# Patient Record
Sex: Female | Born: 1970 | Race: Black or African American | Hispanic: No | Marital: Married | Smoking: Never smoker
Health system: Southern US, Community
[De-identification: ages and names within clinical notes are randomized; demographics above are authoritative.]

## PROBLEM LIST (undated history)

## (undated) HISTORY — PX: CHOLECYSTECTOMY: SHX55

---

## 1999-06-09 ENCOUNTER — Encounter: Payer: Self-pay | Admitting: Emergency Medicine

## 1999-06-09 ENCOUNTER — Emergency Department (HOSPITAL_COMMUNITY): Admission: EM | Admit: 1999-06-09 | Discharge: 1999-06-09 | Payer: Self-pay | Admitting: Emergency Medicine

## 2003-02-17 ENCOUNTER — Other Ambulatory Visit: Admission: RE | Admit: 2003-02-17 | Discharge: 2003-02-17 | Payer: Self-pay | Admitting: Obstetrics and Gynecology

## 2003-07-15 ENCOUNTER — Encounter: Admission: RE | Admit: 2003-07-15 | Discharge: 2003-07-15 | Payer: Self-pay | Admitting: Obstetrics and Gynecology

## 2003-09-08 ENCOUNTER — Inpatient Hospital Stay (HOSPITAL_COMMUNITY): Admission: AD | Admit: 2003-09-08 | Discharge: 2003-09-11 | Payer: Self-pay | Admitting: Obstetrics and Gynecology

## 2003-09-12 ENCOUNTER — Encounter: Admission: RE | Admit: 2003-09-12 | Discharge: 2003-10-12 | Payer: Self-pay | Admitting: Obstetrics and Gynecology

## 2003-10-13 ENCOUNTER — Encounter: Admission: RE | Admit: 2003-10-13 | Discharge: 2003-11-12 | Payer: Self-pay | Admitting: Obstetrics and Gynecology

## 2003-10-21 ENCOUNTER — Other Ambulatory Visit: Admission: RE | Admit: 2003-10-21 | Discharge: 2003-10-21 | Payer: Self-pay | Admitting: Obstetrics and Gynecology

## 2003-12-11 ENCOUNTER — Encounter: Admission: RE | Admit: 2003-12-11 | Discharge: 2004-01-10 | Payer: Self-pay | Admitting: Obstetrics and Gynecology

## 2004-02-10 ENCOUNTER — Encounter: Admission: RE | Admit: 2004-02-10 | Discharge: 2004-03-11 | Payer: Self-pay | Admitting: Obstetrics and Gynecology

## 2004-04-11 ENCOUNTER — Encounter: Admission: RE | Admit: 2004-04-11 | Discharge: 2004-05-11 | Payer: Self-pay | Admitting: Obstetrics and Gynecology

## 2004-05-12 ENCOUNTER — Encounter: Admission: RE | Admit: 2004-05-12 | Discharge: 2004-06-11 | Payer: Self-pay | Admitting: Obstetrics and Gynecology

## 2004-07-12 ENCOUNTER — Encounter: Admission: RE | Admit: 2004-07-12 | Discharge: 2004-08-11 | Payer: Self-pay | Admitting: Obstetrics and Gynecology

## 2019-09-04 ENCOUNTER — Ambulatory Visit: Payer: BC Managed Care – PPO | Attending: Internal Medicine

## 2019-09-04 DIAGNOSIS — Z20822 Contact with and (suspected) exposure to covid-19: Secondary | ICD-10-CM

## 2019-09-06 ENCOUNTER — Emergency Department (HOSPITAL_COMMUNITY): Payer: BC Managed Care – PPO

## 2019-09-06 ENCOUNTER — Other Ambulatory Visit: Payer: Self-pay

## 2019-09-06 ENCOUNTER — Encounter (HOSPITAL_COMMUNITY): Payer: Self-pay

## 2019-09-06 ENCOUNTER — Ambulatory Visit (HOSPITAL_COMMUNITY)
Admission: EM | Admit: 2019-09-06 | Discharge: 2019-09-06 | Disposition: A | Discharge status: Home / Self Care | Payer: BC Managed Care – PPO | Source: Home / Self Care | Attending: Family Medicine | Admitting: Family Medicine

## 2019-09-06 ENCOUNTER — Inpatient Hospital Stay (HOSPITAL_COMMUNITY)
Admission: EM | Admit: 2019-09-06 | Discharge: 2019-09-20 | DRG: 974 | Disposition: A | Discharge status: Home / Self Care | Payer: BC Managed Care – PPO | Source: Ambulatory Visit | Attending: Internal Medicine | Admitting: Internal Medicine

## 2019-09-06 ENCOUNTER — Ambulatory Visit (INDEPENDENT_AMBULATORY_CARE_PROVIDER_SITE_OTHER): Payer: BC Managed Care – PPO

## 2019-09-06 DIAGNOSIS — R1110 Vomiting, unspecified: Secondary | ICD-10-CM | POA: Diagnosis not present

## 2019-09-06 DIAGNOSIS — Z9981 Dependence on supplemental oxygen: Secondary | ICD-10-CM

## 2019-09-06 DIAGNOSIS — J189 Pneumonia, unspecified organism: Secondary | ICD-10-CM

## 2019-09-06 DIAGNOSIS — B59 Pneumocystosis: Secondary | ICD-10-CM | POA: Diagnosis present

## 2019-09-06 DIAGNOSIS — Z9049 Acquired absence of other specified parts of digestive tract: Secondary | ICD-10-CM | POA: Diagnosis not present

## 2019-09-06 DIAGNOSIS — K2970 Gastritis, unspecified, without bleeding: Secondary | ICD-10-CM | POA: Diagnosis not present

## 2019-09-06 DIAGNOSIS — R7401 Elevation of levels of liver transaminase levels: Secondary | ICD-10-CM | POA: Diagnosis not present

## 2019-09-06 DIAGNOSIS — Z98891 History of uterine scar from previous surgery: Secondary | ICD-10-CM | POA: Diagnosis not present

## 2019-09-06 DIAGNOSIS — E871 Hypo-osmolality and hyponatremia: Secondary | ICD-10-CM | POA: Diagnosis not present

## 2019-09-06 DIAGNOSIS — R0602 Shortness of breath: Secondary | ICD-10-CM | POA: Diagnosis not present

## 2019-09-06 DIAGNOSIS — R0902 Hypoxemia: Secondary | ICD-10-CM | POA: Diagnosis present

## 2019-09-06 DIAGNOSIS — J9601 Acute respiratory failure with hypoxia: Secondary | ICD-10-CM | POA: Diagnosis present

## 2019-09-06 DIAGNOSIS — R112 Nausea with vomiting, unspecified: Secondary | ICD-10-CM | POA: Diagnosis not present

## 2019-09-06 DIAGNOSIS — R1013 Epigastric pain: Secondary | ICD-10-CM | POA: Diagnosis not present

## 2019-09-06 DIAGNOSIS — D473 Essential (hemorrhagic) thrombocythemia: Secondary | ICD-10-CM | POA: Diagnosis present

## 2019-09-06 DIAGNOSIS — R441 Visual hallucinations: Secondary | ICD-10-CM | POA: Diagnosis not present

## 2019-09-06 DIAGNOSIS — Z79899 Other long term (current) drug therapy: Secondary | ICD-10-CM | POA: Diagnosis not present

## 2019-09-06 DIAGNOSIS — Z21 Asymptomatic human immunodeficiency virus [HIV] infection status: Secondary | ICD-10-CM | POA: Diagnosis not present

## 2019-09-06 DIAGNOSIS — I313 Pericardial effusion (noninflammatory): Secondary | ICD-10-CM | POA: Diagnosis present

## 2019-09-06 DIAGNOSIS — R110 Nausea: Secondary | ICD-10-CM | POA: Diagnosis not present

## 2019-09-06 DIAGNOSIS — F419 Anxiety disorder, unspecified: Secondary | ICD-10-CM | POA: Diagnosis present

## 2019-09-06 DIAGNOSIS — I4519 Other right bundle-branch block: Secondary | ICD-10-CM | POA: Diagnosis present

## 2019-09-06 DIAGNOSIS — E876 Hypokalemia: Secondary | ICD-10-CM | POA: Diagnosis not present

## 2019-09-06 DIAGNOSIS — B20 Human immunodeficiency virus [HIV] disease: Principal | ICD-10-CM | POA: Diagnosis present

## 2019-09-06 DIAGNOSIS — R400 Somnolence: Secondary | ICD-10-CM | POA: Diagnosis not present

## 2019-09-06 DIAGNOSIS — Z5329 Procedure and treatment not carried out because of patient's decision for other reasons: Secondary | ICD-10-CM | POA: Diagnosis not present

## 2019-09-06 DIAGNOSIS — R590 Localized enlarged lymph nodes: Secondary | ICD-10-CM | POA: Diagnosis not present

## 2019-09-06 DIAGNOSIS — D649 Anemia, unspecified: Secondary | ICD-10-CM | POA: Diagnosis present

## 2019-09-06 DIAGNOSIS — Z20822 Contact with and (suspected) exposure to covid-19: Secondary | ICD-10-CM | POA: Diagnosis present

## 2019-09-06 LAB — BASIC METABOLIC PANEL
Anion gap: 12 (ref 5–15)
BUN: 8 mg/dL (ref 6–20)
CO2: 22 mmol/L (ref 22–32)
Calcium: 9 mg/dL (ref 8.9–10.3)
Chloride: 101 mmol/L (ref 98–111)
Creatinine, Ser: 0.61 mg/dL (ref 0.44–1.00)
GFR calc Af Amer: >60 mL/min (ref >60)
GFR calc non Af Amer: >60 mL/min (ref >60)
Glucose, Bld: 95 mg/dL (ref 70–99)
Potassium: 3.8 mmol/L (ref 3.5–5.1)
Sodium: 135 mmol/L (ref 135–145)

## 2019-09-06 LAB — CBC
HCT: 37.3 % (ref 36.0–46.0)
Hemoglobin: 11.7 g/dL — ABNORMAL LOW (ref 12.0–15.0)
MCH: 26.9 pg (ref 26.0–34.0)
MCHC: 31.4 g/dL (ref 30.0–36.0)
MCV: 85.7 fL (ref 80.0–100.0)
Platelets: 429 10*3/uL — ABNORMAL HIGH (ref 150–400)
RBC: 4.35 MIL/uL (ref 3.87–5.11)
RDW: 14.8 % (ref 11.5–15.5)
WBC: 8.8 10*3/uL (ref 4.0–10.5)
nRBC: 0 % (ref 0.0–0.2)

## 2019-09-06 LAB — POCT I-STAT EG7
Bicarbonate: 25.4 mmol/L (ref 20.0–28.0)
Calcium, Ion: 1.15 mmol/L (ref 1.15–1.40)
HCT: 33 % — ABNORMAL LOW (ref 36.0–46.0)
Hemoglobin: 11.2 g/dL — ABNORMAL LOW (ref 12.0–15.0)
O2 Saturation: 59 %
Potassium: 4 mmol/L (ref 3.5–5.1)
Sodium: 137 mmol/L (ref 135–145)
TCO2: 27 mmol/L (ref 22–32)
pCO2, Ven: 42 mmHg — ABNORMAL LOW (ref 44.0–60.0)
pH, Ven: 7.39 (ref 7.250–7.430)
pO2, Ven: 31 mmHg — CL (ref 32.0–45.0)

## 2019-09-06 LAB — TROPONIN I (HIGH SENSITIVITY): Troponin I (High Sensitivity): <2 ng/L (ref <18)

## 2019-09-06 LAB — I-STAT BETA HCG BLOOD, ED (MC, WL, AP ONLY): I-stat hCG, quantitative: <5 m[IU]/mL (ref <5)

## 2019-09-06 LAB — POC SARS CORONAVIRUS 2 AG -  ED: SARS Coronavirus 2 Ag: NEGATIVE

## 2019-09-06 LAB — POC SARS CORONAVIRUS 2 AG: SARS Coronavirus 2 Ag: NEGATIVE

## 2019-09-06 LAB — BRAIN NATRIURETIC PEPTIDE: B Natriuretic Peptide: 28.4 pg/mL (ref 0.0–100.0)

## 2019-09-06 LAB — NOVEL CORONAVIRUS, NAA: SARS-CoV-2, NAA: NOT DETECTED

## 2019-09-06 MED ORDER — SODIUM CHLORIDE 0.9 % IV SOLN
1.0000 g | Freq: Once | INTRAVENOUS | Status: AC
  Administered 2019-09-06: 1 g via INTRAVENOUS
  Filled 2019-09-06: qty 10

## 2019-09-06 MED ORDER — SODIUM CHLORIDE 0.9 % IV SOLN
500.0000 mg | Freq: Once | INTRAVENOUS | Status: AC
  Administered 2019-09-07: 500 mg via INTRAVENOUS
  Filled 2019-09-06: qty 500

## 2019-09-06 MED ORDER — LACTATED RINGERS IV BOLUS
1000.0000 mL | Freq: Once | INTRAVENOUS | Status: AC
  Administered 2019-09-06: 1000 mL via INTRAVENOUS

## 2019-09-06 MED ORDER — IOHEXOL 350 MG/ML SOLN
100.0000 mL | Freq: Once | INTRAVENOUS | Status: AC | PRN
  Administered 2019-09-06: 100 mL via INTRAVENOUS

## 2019-09-06 NOTE — H&P (Addendum)
History and Physical    TORRE SHUTES [id number] DOB: 16-Nov-1970 DOA: 09/06/2019  PCP: Patient, No Pcp Per  Patient coming from: home   Chief Complaint: cough  HPI: FALLEN DOHNER is a 49 y.o. female with medical history significant for nothing presents w/ above.  Usual state of health until 12/24. Then developed shortness of breath with ambulation. Also with cough. Temp to 101 or 102. Also fatigue, decreased appetite. Cough is dry. No vomiting. One episode of diarrhea. Had a covid test at work (negative), 2 days ago (negative), and today at urgent care (negative), all since symptoms started. Hasn't been treated with anything. No hx pneumonia. No personal or fam hx of venous thrombus. No recent illness/hospitalization. No known covid exposure, whole family recently tested, negative.   ED Course: imaging, labs, abx  Review of Systems: As per HPI otherwise 10 point review of systems negative.    History reviewed. No pertinent past medical history.  Past Surgical History:  Procedure Laterality Date  . CESAREAN SECTION    . CHOLECYSTECTOMY       reports that she has never smoked. She has never used smokeless tobacco. She reports that she does not drink alcohol or use drugs.  No Known Allergies  No family history on file.  Prior to Admission medications   Not on File    Physical Exam: Vitals:   09/06/19 1845 09/06/19 2000 09/06/19 2015 09/06/19 2030  BP: (!) 131/94 121/83 122/86 120/82  Pulse: (!) 105 78 87 79  Resp: (!) 27 (!) 47 (!) 43 (!) 48  Temp:      TempSrc:      SpO2: 96% 100% 100% 100%  Weight:      Height:        Constitutional: No acute distress Head: Atraumatic Eyes: Conjunctiva clear ENM: Moist mucous membranes. Normal dentition.  Neck: Supple Respiratory: tachypnic. Scattered rhonchi Cardiovascular: Regular rate and rhythm. No murmurs/rubs/gallops. Abdomen: Non-tender, non-distended. No masses. No rebound or guarding. Positive bowel  sounds. Musculoskeletal: No joint deformity upper and lower extremities. Normal ROM, no contractures. Normal muscle tone.  Skin: No rashes, lesions, or ulcers.  Extremities: No peripheral edema. Palpable peripheral pulses. Neurologic: Alert, moving all 4 extremities. Psychiatric: Normal insight and judgement.   Labs on Admission: I have personally reviewed following labs and imaging studies  CBC: Recent Labs  Lab 09/06/19 1605 09/06/19 2049  WBC 8.8  --   HGB 11.7* 11.2*  HCT 37.3 33.0*  MCV 85.7  --   PLT 429*  --    Basic Metabolic Panel: Recent Labs  Lab 09/06/19 1605 09/06/19 2049  NA 135 137  K 3.8 4.0  CL 101  --   CO2 22  --   GLUCOSE 95  --   BUN 8  --   CREATININE 0.61  --   CALCIUM 9.0  --    GFR: Estimated Creatinine Clearance: 68 mL/min (by C-G formula based on SCr of 0.61 mg/dL). Liver Function Tests: No results for input(s): AST, ALT, ALKPHOS, BILITOT, PROT, ALBUMIN in the last 168 hours. No results for input(s): LIPASE, AMYLASE in the last 168 hours. No results for input(s): AMMONIA in the last 168 hours. Coagulation Profile: No results for input(s): INR, PROTIME in the last 168 hours. Cardiac Enzymes: No results for input(s): CKTOTAL, CKMB, CKMBINDEX, TROPONINI in the last 168 hours. BNP (last 3 results) No results for input(s): PROBNP in the last 8760 hours. HbA1C: No results for input(s): HGBA1C in the last  72 hours. CBG: No results for input(s): GLUCAP in the last 168 hours. Lipid Profile: No results for input(s): CHOL, HDL, LDLCALC, TRIG, CHOLHDL, LDLDIRECT in the last 72 hours. Thyroid Function Tests: No results for input(s): TSH, T4TOTAL, FREET4, T3FREE, THYROIDAB in the last 72 hours. Anemia Panel: No results for input(s): VITAMINB12, FOLATE, FERRITIN, TIBC, IRON, RETICCTPCT in the last 72 hours. Urine analysis: No results found for: COLORURINE, APPEARANCEUR, LABSPEC, [place], GLUCOSEU, HGBUR, BILIRUBINUR, KETONESUR, PROTEINUR,  UROBILINOGEN, NITRITE, LEUKOCYTESUR  Radiological Exams on Admission: DG Chest 2 View  Result Date: 09/06/2019 CLINICAL DATA:  Shortness of breath EXAM: CHEST - 2 VIEW COMPARISON:  None. FINDINGS: The heart size and mediastinal contours are within normal limits. Bilateral heterogeneous airspace opacity, most conspicuous in the bilateral lung bases. The visualized skeletal structures are unremarkable. IMPRESSION: Bilateral heterogeneous airspace opacity, most conspicuous in the bilateral lung bases, consistent with multifocal infection and COVID-19 infection, if suspected. Electronically Signed   By: Eddie Candle M.D.   On: 09/06/2019 14:32   CT Angio Chest PE W and/or Wo Contrast  Result Date: 09/06/2019 CLINICAL DATA:  Shortness of breath recent COVID diagnosis EXAM: CT ANGIOGRAPHY CHEST WITH CONTRAST TECHNIQUE: Multidetector CT imaging of the chest was performed using the standard protocol during bolus administration of intravenous contrast. Multiplanar CT image reconstructions and MIPs were obtained to evaluate the vascular anatomy. CONTRAST:  76 mL OMNIPAQUE IOHEXOL 350 MG/ML SOLN COMPARISON:  Chest x-ray 09/06/2019 FINDINGS: Cardiovascular: Satisfactory opacification of the pulmonary arteries to the segmental level by density measurements though overall noisy appearance of the images. Limited evaluation for subsegmental PE in the lower lobes secondary to respiratory motion artifact. Nonaneurysmal aorta. No dissection seen. Normal heart size. No pericardial effusion. Mediastinum/Nodes: No enlarged mediastinal, hilar, or axillary lymph nodes. Thyroid gland, trachea, and esophagus demonstrate no significant findings. Lungs/Pleura: Extensive heterogeneous bilateral ground-glass density. No pleural effusion or pneumothorax. Upper Abdomen: No acute abnormality. Musculoskeletal: No chest wall abnormality. No acute or significant osseous findings. Review of the MIP images confirms the above findings. IMPRESSION:  1. Limited evaluation for distal PE in the lower lobes secondary to respiratory motion artifact. No acute central embolus is seen. 2. Extensive heterogeneous bilateral ground-glass densities, consistent with bilateral pneumonia and given history of COVID positivity. Electronically Signed   By: Donavan Foil M.D.   On: 09/06/2019 21:13    EKG: Independently reviewed. rbb incomplete  Assessment/Plan Principal Problem:   CAP (community acquired pneumonia) Active Problems:   Hypoxia   # Community acquired pneumonia - patchy ground glass opacities on cxr; 2 wks cough and fevers and shortness of breath. Highly suspicious for covid (no known exposures), but negative antigen test today, negative pcr 2 days ago, and negative test at work a week or so ago. CTA w/o signs PE but suboptimal study. No signs DVT on exam. Mildly hypoxic to low 80s and tachypnic, but resting comfortably. - cont ceftriaxone and azithromycin - f/u procalcitonin - f/u inflammatory markers, consider repeat cta if d dimer markedly elevated - f/u urine legionella and pneumococcal antigens - cont o2 - incentive spirometry - f/u repeat covid pcr; airborne/contact precautions for now - f/u flu  - d5 1/2 ns @ 100  # anemia - mild, normocytic. With mild thrombocytosis - repeat cbc in am, with ferritin  DVT prophylaxis: lovenox Code Status: full  Family Communication: daughter  Disposition Plan: tbd  Consults called: none  Admission status: med/surg    Desma Maxim MD [facility] Pager [phone]  If  7PM-7AM, please contact night-coverage [email or web address] Password [facility]  09/06/2019, 10:45 PM

## 2019-09-06 NOTE — ED Triage Notes (Signed)
Pt sent here from UC for further evaluation of sob since christmas day, pt had two negative covid tests since then. resp labored, tachypnea noted. Pt a.o, pt 98% in triage, 2L [place] applied for comfort

## 2019-09-06 NOTE — ED Notes (Addendum)
This tech assisted the pt to the bedside commode to urinate.   The pt seemed to maintain her sats at 98-100% on 4L [place] while on bedside commode.Marland Kitchen  Upon moving back to the bed, the pt became tachypneic to the point of hyperventalation at 36 breaths a minute and and sats registered at 78%.  This tech titrated her 02 from 4L to 6 L [place] and coached the pt on slowing her breathing.  After a couple of minutes, the pt's sat's returned to 100% and her resp. Rate returned to 17 bpm.  The pt is now maintaining 100% at 4L [place].

## 2019-09-06 NOTE — ED Triage Notes (Signed)
Pt reports she feels SOB since Christmas. Pt reports negative COVID test 2 days ago. Pt denies chest pain.

## 2019-09-06 NOTE — ED Notes (Signed)
  Took patient to the restroom. Pt stated she couldn't catch her breath. O2 93% on 2L nasal cannula.

## 2019-09-06 NOTE — ED Notes (Signed)
Patient ambulatory in hallway, HR 127 and O2 88% on room air. Will notify provider, pt hyperventilating while ambulating.

## 2019-09-06 NOTE — Discharge Instructions (Addendum)
You will need further evaluation and treatment, as your oxygen levels are too low.

## 2019-09-06 NOTE — ED Notes (Signed)
Vital signs reported to provider N. Burky.

## 2019-09-06 NOTE — ED Provider Notes (Signed)
[facility]    CSN: [id number] Arrival date & time: 09/06/19  1340      History   Chief Complaint Chief Complaint  Patient presents with  . Shortness of Breath    HPI Deborah Durham is a 49 y.o. female.   Deborah Durham presents with complaints of shortness of breath . Started 12/25. covid test done at work which was negative. Had to leave work the following week due to shortness of breath. Went to [facility] two days ago and had another covid test which was negative. Still feels shortness of breath . Chest pain  If she lays on her right side. Fevers, 12/25 up to 102. No known ill contacts. Works for [employer] so is around a lot of people, wears mask and gloves. Her mother and son tested negative for covid-19, they have not been ill. Sore throat intermittently. Doesn't smoke. Not on birth control. No recent travel. Pain with deep inspiration. Diarrhea in the past week. Denies any previous similar. Has had the flu in the past but states this feels different. Headache's. No body aches. Tylenol, has helped with headache. mucinex has helped as well. No leg swelling, no calf pain.      History reviewed. No pertinent past medical history.  There are no problems to display for this patient.   Past Surgical History:  Procedure Laterality Date  . CESAREAN SECTION    . CHOLECYSTECTOMY      OB History   No obstetric history on file.      Home Medications    Prior to Admission medications   Not on File    Family History History reviewed. No pertinent family history.  Social History Social History   Tobacco Use  . Smoking status: Never Smoker  . Smokeless tobacco: Never Used  Substance Use Topics  . Alcohol use: Never  . Drug use: Never     Allergies   Patient has no known allergies.   Review of Systems Review of Systems   Physical Exam Triage Vital Signs ED Triage Vitals  Enc Vitals Group     BP 09/06/19 1408 115/81     Pulse  Rate 09/06/19 1408 (!) 114     Resp 09/06/19 1408 (!) 22     Temp 09/06/19 1408 99.3 F (37.4 C)     Temp Source 09/06/19 1408 Oral     SpO2 09/06/19 1408 93 %     Weight --      Height --      Head Circumference --      Peak Flow --      Pain Score 09/06/19 1410 0     Pain Loc --      Pain Edu? --      Excl. in [place]? --    No data found.  Updated Vital Signs BP 115/81 (BP Location: Right Arm)   Pulse (!) 114   Temp 99.3 F (37.4 C) (Oral)   Resp (!) 22   LMP  (Within Weeks) Comment: 4 weeks  SpO2 93%    Physical Exam Constitutional:      Appearance: She is ill-appearing.  HENT:     Head: Normocephalic and atraumatic.  Cardiovascular:     Rate and Rhythm: Tachycardia present.  Pulmonary:     Effort: Tachypnea present.     Breath sounds: Decreased breath sounds present. No wheezing.     Comments: Patient speaking in short sentences with quick shallow breathing with noted shortness  of breath  At rest; no cough throughout exam and no obvious abnormal lung sounds  Neurological:     Mental Status: She is alert.    EKG:  Sinus tach. No stwave changes as interpreted by me.    UC Treatments / Results  Labs (all labs ordered are listed, but only abnormal results are displayed) Labs Reviewed  POC SARS CORONAVIRUS 2 AG -  ED    EKG   Radiology DG Chest 2 View  Result Date: 09/06/2019 CLINICAL DATA:  Shortness of breath EXAM: CHEST - 2 VIEW COMPARISON:  None. FINDINGS: The heart size and mediastinal contours are within normal limits. Bilateral heterogeneous airspace opacity, most conspicuous in the bilateral lung bases. The visualized skeletal structures are unremarkable. IMPRESSION: Bilateral heterogeneous airspace opacity, most conspicuous in the bilateral lung bases, consistent with multifocal infection and COVID-19 infection, if suspected. Electronically Signed   By: Eddie Candle M.D.   On: 09/06/2019 14:32    Procedures Procedures (including critical care  time)  Medications Ordered in UC Medications - No data to display  Initial Impression / Assessment and Plan / UC Course  I have reviewed the triage vital signs and the nursing notes.  Pertinent labs & imaging results that were available during my care of the patient were reviewed by me and considered in my medical decision making (see chart for details).     Patient with significant dyspnea and work of breathing. O2 93% at rest, drops to 88% with ambulation. Chest xray with bilateral multifocal infection. She is otherwise healthy. Negative rapid covid testing, outside of range however, with xray this is still highly suspicious of covid-19  1507: CMA notified this provider that patient desat to 88% on RA with ambulation. Once back in room at rest continued to drop to 80%. O2 by nasal cannula applied to get sats >90%. sats at 100% with 2LNC and HR improved to 88. Patient transported to ED for further evaluation and management by CMA from UC.   Final Clinical Impressions(s) / UC Diagnoses   Final diagnoses:  SOB (shortness of breath)  Hypoxia     Discharge Instructions     You will need further evaluation and treatment, as your oxygen levels are too low.     ED Prescriptions    None     PDMP not reviewed this encounter.   Zigmund Gottron, NP 09/06/19 1515

## 2019-09-06 NOTE — ED Notes (Signed)
Patient is being discharged from the Urgent [facility] and sent to the Emergency Department via wheelchair by staff. Per Lanelle Bal, NP, patient is stable but in need of higher level of care due to dasaturation at rest (down to 80% on room air). Patient is aware and verbalizes understanding of plan of care, brought to ED on O2 via [place].  Vitals:   09/06/19 1408  BP: 115/81  Pulse: (!) 114  Resp: (!) 22  Temp: 99.3 F (37.4 C)  SpO2: 93%

## 2019-09-06 NOTE — ED Provider Notes (Signed)
[facility] EMERGENCY DEPARTMENT Provider Note   CSN: [id number] Arrival date & time: 09/06/19  1543     History Chief Complaint  Patient presents with  . Shortness of Breath    Deborah Durham is a 49 y.o. female.  HPI Deborah Durham is a 49 y.o. female with no pertinent past medical history who presents to the ED for shortness of breath. She reports has had these similar symptoms of right sided chest discomfort, shortness of breath, fever and tachypnea for the past three weeks. She denies h/o cardiac or pulmonary issues. She has had four recent negative covid tests. She states mild improvement with mucinex but has not tried any other medicines. She denies abdominal pain, vomiting, diarrhea.      History reviewed. No pertinent past medical history.  There are no problems to display for this patient.   Past Surgical History:  Procedure Laterality Date  . CESAREAN SECTION    . CHOLECYSTECTOMY       OB History   No obstetric history on file.     No family history on file.  Social History   Tobacco Use  . Smoking status: Never Smoker  . Smokeless tobacco: Never Used  Substance Use Topics  . Alcohol use: Never  . Drug use: Never    Home Medications Prior to Admission medications   Not on File    Allergies    Patient has no known allergies.  Review of Systems   Review of Systems  Constitutional: Positive for fever. Negative for chills.  HENT: Negative for ear pain and sore throat.   Eyes: Negative for pain and visual disturbance.  Respiratory: Positive for cough and shortness of breath.   Cardiovascular: Positive for chest pain. Negative for palpitations.  Gastrointestinal: Negative for abdominal pain and vomiting.  Genitourinary: Negative for dysuria and hematuria.  Musculoskeletal: Negative for arthralgias and back pain.  Skin: Negative for color change and rash.  Neurological: Negative for seizures and syncope.  All other  systems reviewed and are negative.   Physical Exam Updated Vital Signs BP 120/82   Pulse 79   Temp 98.5 F (36.9 C) (Oral)   Resp (!) 48   Ht 5\' 2"  (1.575 m)   Wt 59.9 kg   LMP 08/06/2019   SpO2 100%   BMI 24.14 kg/m   Physical Exam Vitals and nursing note reviewed.  Constitutional:      General: She is in acute distress.     Appearance: Normal appearance. She is well-developed. She is not toxic-appearing.     Comments: Female who appears stated age, tachypneic, on 2 liters nasal cannula, anxious  HENT:     Head: Normocephalic and atraumatic.     Right Ear: External ear normal.     Left Ear: External ear normal.     Nose: Nose normal. No rhinorrhea.     Mouth/Throat:     Mouth: Mucous membranes are moist.  Eyes:     General:        Right eye: No discharge.        Left eye: No discharge.     Conjunctiva/sclera: Conjunctivae normal.  Cardiovascular:     Rate and Rhythm: Regular rhythm. Tachycardia present.     Pulses: Normal pulses.     Heart sounds: Normal heart sounds. No murmur.  Pulmonary:     Effort: Respiratory distress present.     Breath sounds: Normal breath sounds. No wheezing or rales.  Comments: Tachypneic, lungs ctab Abdominal:     General: Abdomen is flat. There is no distension.     Palpations: Abdomen is soft.     Tenderness: There is no abdominal tenderness.  Musculoskeletal:        General: No deformity or signs of injury. Normal range of motion.     Cervical back: Normal range of motion and neck supple.  Skin:    General: Skin is warm and dry.     Capillary Refill: Capillary refill takes less than 2 seconds.     Coloration: Skin is not jaundiced.  Neurological:     General: No focal deficit present.     Mental Status: She is alert. Mental status is at baseline.  Psychiatric:        Mood and Affect: Mood normal.        Behavior: Behavior normal.     ED Results / Procedures / Treatments   Labs (all labs ordered are listed, but only  abnormal results are displayed) Labs Reviewed  CBC - Abnormal; Notable for the following components:      Result Value   Hemoglobin 11.7 (*)    Platelets 429 (*)    All other components within normal limits  POCT I-STAT EG7 - Abnormal; Notable for the following components:   pCO2, Ven 42.0 (*)    pO2, Ven 31.0 (*)    HCT 33.0 (*)    Hemoglobin 11.2 (*)    All other components within normal limits  SARS CORONAVIRUS 2 (TAT 6-24 HRS)  BASIC METABOLIC PANEL  BRAIN NATRIURETIC PEPTIDE  I-STAT BETA HCG BLOOD, ED ([facility], [facility], [facility] ONLY)  TROPONIN I (HIGH SENSITIVITY)    EKG None  Radiology DG Chest 2 View  Result Date: 09/06/2019 CLINICAL DATA:  Shortness of breath EXAM: CHEST - 2 VIEW COMPARISON:  None. FINDINGS: The heart size and mediastinal contours are within normal limits. Bilateral heterogeneous airspace opacity, most conspicuous in the bilateral lung bases. The visualized skeletal structures are unremarkable. IMPRESSION: Bilateral heterogeneous airspace opacity, most conspicuous in the bilateral lung bases, consistent with multifocal infection and COVID-19 infection, if suspected. Electronically Signed   By: Eddie Candle M.D.   On: 09/06/2019 14:32   CT Angio Chest PE W and/or Wo Contrast  Result Date: 09/06/2019 CLINICAL DATA:  Shortness of breath recent COVID diagnosis EXAM: CT ANGIOGRAPHY CHEST WITH CONTRAST TECHNIQUE: Multidetector CT imaging of the chest was performed using the standard protocol during bolus administration of intravenous contrast. Multiplanar CT image reconstructions and MIPs were obtained to evaluate the vascular anatomy. CONTRAST:  76 mL OMNIPAQUE IOHEXOL 350 MG/ML SOLN COMPARISON:  Chest x-ray 09/06/2019 FINDINGS: Cardiovascular: Satisfactory opacification of the pulmonary arteries to the segmental level by density measurements though overall noisy appearance of the images. Limited evaluation for subsegmental PE in the lower lobes secondary to respiratory motion  artifact. Nonaneurysmal aorta. No dissection seen. Normal heart size. No pericardial effusion. Mediastinum/Nodes: No enlarged mediastinal, hilar, or axillary lymph nodes. Thyroid gland, trachea, and esophagus demonstrate no significant findings. Lungs/Pleura: Extensive heterogeneous bilateral ground-glass density. No pleural effusion or pneumothorax. Upper Abdomen: No acute abnormality. Musculoskeletal: No chest wall abnormality. No acute or significant osseous findings. Review of the MIP images confirms the above findings. IMPRESSION: 1. Limited evaluation for distal PE in the lower lobes secondary to respiratory motion artifact. No acute central embolus is seen. 2. Extensive heterogeneous bilateral ground-glass densities, consistent with bilateral pneumonia and given history of COVID positivity. Electronically Signed   By:  Donavan Foil M.D.   On: 09/06/2019 21:13    Procedures Procedures (including critical care time)  Medications Ordered in ED Medications  cefTRIAXone (ROCEPHIN) 1 g in sodium chloride 0.9 % 100 mL IVPB (has no administration in time range)  azithromycin (ZITHROMAX) 500 mg in sodium chloride 0.9 % 250 mL IVPB (has no administration in time range)  lactated ringers bolus 1,000 mL (1,000 mLs Intravenous New Bag/Given 09/06/19 1935)  iohexol (OMNIPAQUE) 350 MG/ML injection 100 mL (100 mLs Intravenous Contrast Given 09/06/19 2049)    ED Course  I have reviewed the triage vital signs and the nursing notes.  Pertinent labs & imaging results that were available during my care of the patient were reviewed by me and considered in my medical decision making (see chart for details).    MDM Rules/Calculators/A&P                      ALLURE IGOE is a 49 y.o. female with no pertinent past medical history who presents to the ED for shortness of breath. She reports has had these similar symptoms of right sided chest discomfort, shortness of breath, fever and tachypnea for the past  three weeks. She denies h/o cardiac or pulmonary issues. She has had four recent negative covid tests. She states mild improvement with mucinex but has not tried any other medicines. She denies abdominal pain, vomiting, diarrhea.  HPI and physical exam as above. Female who appears stated age, tachypneic, on 2 liters nasal cannula, anxious, awake, alert, hemodynamically stable.  She has had four recent covid tests and she reports similar symptoms today that she recently has had. Less likely covid and more likely another viral cause or bacterial cause for pneumonia. We decided to workup for pe, ct pe study does not demonstrate large large pulmonary embolism. Low suspicion for acs, heart failure. We started her on a course of antibiotics. She has an oxygen requirement of 3 liters and desaturates to 84% on room air.  I consulted medicine for admission, they have evaluated the patient and agree with plan for admission. I discussed this plan with the patient and family, they understand and agree with plan for admission.   Final Clinical Impression(s) / ED Diagnoses Final diagnoses:  None    Rx / DC Orders ED Discharge Orders    None       Teneka Malmberg, Lovena Le, MD 09/06/19 [place], [place], DO 09/06/19 2256

## 2019-09-07 LAB — CBC
HCT: 34.7 % — ABNORMAL LOW (ref 36.0–46.0)
Hemoglobin: 10.7 g/dL — ABNORMAL LOW (ref 12.0–15.0)
MCH: 26.7 pg (ref 26.0–34.0)
MCHC: 30.8 g/dL (ref 30.0–36.0)
MCV: 86.5 fL (ref 80.0–100.0)
Platelets: 400 10*3/uL (ref 150–400)
RBC: 4.01 MIL/uL (ref 3.87–5.11)
RDW: 15 % (ref 11.5–15.5)
WBC: 9.4 10*3/uL (ref 4.0–10.5)
nRBC: 0 % (ref 0.0–0.2)

## 2019-09-07 LAB — SARS CORONAVIRUS 2 (TAT 6-24 HRS)
SARS Coronavirus 2: NEGATIVE
SARS Coronavirus 2: NEGATIVE

## 2019-09-07 LAB — C-REACTIVE PROTEIN: CRP: 8.9 mg/dL — ABNORMAL HIGH (ref <1.0)

## 2019-09-07 LAB — INFLUENZA PANEL BY PCR (TYPE A & B)
Influenza A By PCR: NEGATIVE
Influenza B By PCR: NEGATIVE

## 2019-09-07 LAB — STREP PNEUMONIAE URINARY ANTIGEN: Strep Pneumo Urinary Antigen: NEGATIVE

## 2019-09-07 LAB — HEPATIC FUNCTION PANEL
ALT: 16 U/L (ref 0–44)
AST: 46 U/L — ABNORMAL HIGH (ref 15–41)
Albumin: 2.8 g/dL — ABNORMAL LOW (ref 3.5–5.0)
Alkaline Phosphatase: 50 U/L (ref 38–126)
Bilirubin, Direct: 0.1 mg/dL (ref 0.0–0.2)
Indirect Bilirubin: 0.3 mg/dL (ref 0.3–0.9)
Total Bilirubin: 0.4 mg/dL (ref 0.3–1.2)
Total Protein: 7.3 g/dL (ref 6.5–8.1)

## 2019-09-07 LAB — CREATININE, SERUM
Creatinine, Ser: 0.6 mg/dL (ref 0.44–1.00)
GFR calc Af Amer: >60 mL/min (ref >60)
GFR calc non Af Amer: >60 mL/min (ref >60)

## 2019-09-07 LAB — PROCALCITONIN: Procalcitonin: 0.12 ng/mL

## 2019-09-07 LAB — FERRITIN: Ferritin: 29 ng/mL (ref 11–307)

## 2019-09-07 LAB — HIV ANTIBODY (ROUTINE TESTING W REFLEX): HIV Screen 4th Generation wRfx: REACTIVE — AB

## 2019-09-07 LAB — D-DIMER, QUANTITATIVE: D-Dimer, Quant: 0.75 ug/mL-FEU — ABNORMAL HIGH (ref 0.00–0.50)

## 2019-09-07 MED ORDER — ENOXAPARIN SODIUM 40 MG/0.4ML ~~LOC~~ SOLN
40.0000 mg | SUBCUTANEOUS | Status: DC
  Administered 2019-09-07 – 2019-09-12 (×6): 40 mg via SUBCUTANEOUS
  Filled 2019-09-07 (×6): qty 0.4

## 2019-09-07 MED ORDER — DEXTROSE-NACL 5-0.45 % IV SOLN
INTRAVENOUS | Status: DC
  Administered 2019-09-07: 100 mL/h via INTRAVENOUS

## 2019-09-07 MED ORDER — SODIUM CHLORIDE 0.9 % IV SOLN
500.0000 mg | INTRAVENOUS | Status: AC
  Administered 2019-09-07 – 2019-09-08 (×2): 500 mg via INTRAVENOUS
  Filled 2019-09-07 (×4): qty 500

## 2019-09-07 MED ORDER — SODIUM CHLORIDE 0.9 % IV SOLN
1.0000 g | INTRAVENOUS | Status: DC
  Administered 2019-09-07 – 2019-09-08 (×2): 1 g via INTRAVENOUS
  Filled 2019-09-07: qty 1
  Filled 2019-09-07 (×2): qty 10
  Filled 2019-09-07: qty 1
  Filled 2019-09-07: qty 10

## 2019-09-07 MED ORDER — ACETAMINOPHEN 650 MG RE SUPP: 650.0000 mg | Freq: Four times a day (QID) | RECTAL | Status: DC | PRN

## 2019-09-07 MED ORDER — ACETAMINOPHEN 325 MG PO TABS
650.0000 mg | ORAL_TABLET | Freq: Four times a day (QID) | ORAL | Status: DC | PRN
  Administered 2019-09-07 – 2019-09-11 (×6): 650 mg via ORAL
  Filled 2019-09-07 (×6): qty 2

## 2019-09-07 NOTE — Progress Notes (Signed)
[facility]                                                                              Patient Demographics  Deborah Durham, is a 49 y.o. female, DOB - [birth year], ZC:[id number]  Admit date - 09/06/2019   Admitting Physician Gwynne Edinger, MD  Outpatient Primary MD for the patient is Patient, No Pcp Per  Outpatient specialists:   LOS - 1  days    Chief Complaint  Patient presents with  . Shortness of Breath       Brief summary  Deborah Durham is a 49 y.o. female with no significantmedical history  Presenting with more than 2 history of nonproductive cough associated with fever, chills, decreased appetite easy  Fatigability and 1 episode of diarrhea.  No history of known exposure to COVID 19.   Patient has had 3- COVID-19 testing 1 at work, another 2 days prior to presentation and under on the day of presentation.    Patient was tachypneic and hypoxic at the ED with chest x-ray indicating ground glass opacifications concerning for COVID-19 infection.    Assessment & Plan    Principal Problem:   CAP (community acquired pneumonia) Active Problems:   Hypoxia  Community acquired pneumonia   hypoxic and tachypneic on presentation COVID-19 negative on admission (has had 3- testing historically per patient) Chest x-ray with  Patchy ground-glass opacifications  CT angiogram negative for PE  no evidence of DVT  continue antibiotics ceftriaxone with Zithromax for calmed acquired pneumonia Follow-up urine Legionella and pneumococcal antigens Supplemental oxygen Incentive spirometry  Supportive care  Normocytic anemia with thrombocytosis  Mild Follow clinically   Code Status: full code DVT Prophylaxis:  Lovenox   Family Communication: Discussed in detail with the patient, all imaging results, lab results explained to the patient    Disposition Plan: Home  Time Spent in minutes  35 minutes  Procedures:    Consultants:    n/a  Antimicrobials:      Medications  Scheduled Meds: . enoxaparin (LOVENOX) injection  40 mg Subcutaneous Q24H   Continuous Infusions: . azithromycin    . cefTRIAXone (ROCEPHIN)  IV    . dextrose 5 % and 0.45% NaCl 100 mL/hr (09/07/19 0600)   PRN Meds:.acetaminophen **OR** acetaminophen   Antibiotics   Anti-infectives (From admission, onward)   Start     Dose/Rate Route Frequency Ordered Stop   09/07/19 2200  azithromycin (ZITHROMAX) 500 mg in sodium chloride 0.9 % 250 mL IVPB     500 mg 250 mL/hr over 60 Minutes Intravenous Every 24 hours 09/07/19 0307 09/09/19 2159   09/07/19 2200  cefTRIAXone (ROCEPHIN) 1 g in sodium chloride 0.9 % 100 mL IVPB     1 g 200 mL/hr over 30 Minutes Intravenous Every 24 hours 09/07/19 0307     09/06/19 2200  cefTRIAXone (ROCEPHIN) 1 g in sodium chloride 0.9 % 100 mL IVPB     1 g 200 mL/hr over 30 Minutes Intravenous  Once 09/06/19 2133 09/07/19 0045   09/06/19 2200  azithromycin (ZITHROMAX) 500 mg in sodium chloride 0.9 % 250 mL IVPB  500 mg 250 mL/hr over 60 Minutes Intravenous  Once 09/06/19 2133 09/07/19 0157        Subjective:   Deborah Durham was seen and examined today.  Patient denies dizziness, chest pain no chills, feels better  Objective:   Vitals:   09/07/19 1030 09/07/19 1045 09/07/19 1200 09/07/19 1300  BP: 114/67 107/73 106/72 120/89  Pulse: (!) 106 92 79 80  Resp: (!) 26 (!) 28 (!) 33 (!) 29  Temp:      TempSrc:      SpO2: 99% 100% 100% 100%  Weight:      Height:       No intake or output data in the 24 hours ending 09/07/19 1445   Wt Readings from Last 3 Encounters:  09/06/19 59.9 kg     Exam  General: NAD  HEENT: NCAT,  PERRL,MMM  Neck: SUPPLE, (-) JVD  Cardiovascular: RRR, (-) GALLOP, (-) MURMUR  Respiratory:  Bilateral few occasional scattered rhonchi  Gastrointestinal: SOFT, (-) DISTENSION, BS(+), (_) TENDERNESS  Ext: (-) CYANOSIS, (-) EDEMA  Neuro: A, OX 3  Skin:(-)  RASH  Psych:NORMAL AFFECT/MOOD   Data Reviewed:  I have personally reviewed following labs and imaging studies  Micro Results Recent Results (from the past 240 hour(s))  Novel Coronavirus, NAA ([facility])     Status: None   Collection Time: 09/04/19  9:15 AM   Specimen: Nasopharyngeal(NP) swabs in vial transport medium   NASOPHARYNGE  TESTING  Result Value Ref Range Status   SARS-CoV-2, NAA Not Detected Not Detected Final    Comment: This nucleic acid amplification test was developed and its performance characteristics determined by [employer]. Nucleic acid amplification tests include PCR and TMA. This test has not been FDA cleared or approved. This test has been authorized by FDA under an Emergency Use Authorization (EUA). This test is only authorized for the duration of time the declaration that circumstances exist justifying the authorization of the emergency use of in vitro diagnostic tests for detection of SARS-CoV-2 virus and/or diagnosis of COVID-19 infection under section 564(b)(1) of the Act, 21 U.S.C. [id number]) (1), unless the authorization is terminated or revoked sooner. When diagnostic testing is negative, the possibility of a false negative result should be considered in the context of a patient's recent exposures and the presence of clinical signs and symptoms consistent with COVID-19. An individual without symptoms of COVID-19 and who is not shedding SARS-CoV-2 virus would  expect to have a negative (not detected) result in this assay.   SARS CORONAVIRUS 2 (TAT 6-24 HRS) Nasopharyngeal Nasopharyngeal Swab     Status: None   Collection Time: 09/07/19 12:07 AM   Specimen: Nasopharyngeal Swab  Result Value Ref Range Status   SARS Coronavirus 2 NEGATIVE NEGATIVE Final    Comment: (NOTE) SARS-CoV-2 target nucleic acids are NOT DETECTED. The SARS-CoV-2 RNA is generally detectable in upper and lower respiratory specimens during the acute phase of infection.  Negative results do not preclude SARS-CoV-2 infection, do not rule out co-infections with other pathogens, and should not be used as the sole basis for treatment or other patient management decisions. Negative results must be combined with clinical observations, patient history, and epidemiological information. The expected result is Negative. Fact Sheet for Patients: SugarRoll.be Fact Sheet for Healthcare Providers: https://www.woods-mathews.com/ This test is not yet approved or cleared by the [place] FDA and  has been authorized for detection and/or diagnosis of SARS-CoV-2 by FDA under an Emergency Use Authorization (EUA). This EUA will remain  in effect (meaning this test can be used) for the duration of the COVID-19 declaration under Section 56 4(b)(1) of the Act, 21 U.S.C. section 360bbb-3(b)(1), unless the authorization is terminated or revoked sooner. Performed at [facility], [place] [address], [place], [place] [address]     Radiology Reports DG Chest 2 View  Result Date: 09/06/2019 CLINICAL DATA:  Shortness of breath EXAM: CHEST - 2 VIEW COMPARISON:  None. FINDINGS: The heart size and mediastinal contours are within normal limits. Bilateral heterogeneous airspace opacity, most conspicuous in the bilateral lung bases. The visualized skeletal structures are unremarkable. IMPRESSION: Bilateral heterogeneous airspace opacity, most conspicuous in the bilateral lung bases, consistent with multifocal infection and COVID-19 infection, if suspected. Electronically Signed   By: Eddie Candle M.D.   On: 09/06/2019 14:32   CT Angio Chest PE W and/or Wo Contrast  Result Date: 09/06/2019 CLINICAL DATA:  Shortness of breath recent COVID diagnosis EXAM: CT ANGIOGRAPHY CHEST WITH CONTRAST TECHNIQUE: Multidetector CT imaging of the chest was performed using the standard protocol during bolus administration of intravenous contrast. Multiplanar CT  image reconstructions and MIPs were obtained to evaluate the vascular anatomy. CONTRAST:  76 mL OMNIPAQUE IOHEXOL 350 MG/ML SOLN COMPARISON:  Chest x-ray 09/06/2019 FINDINGS: Cardiovascular: Satisfactory opacification of the pulmonary arteries to the segmental level by density measurements though overall noisy appearance of the images. Limited evaluation for subsegmental PE in the lower lobes secondary to respiratory motion artifact. Nonaneurysmal aorta. No dissection seen. Normal heart size. No pericardial effusion. Mediastinum/Nodes: No enlarged mediastinal, hilar, or axillary lymph nodes. Thyroid gland, trachea, and esophagus demonstrate no significant findings. Lungs/Pleura: Extensive heterogeneous bilateral ground-glass density. No pleural effusion or pneumothorax. Upper Abdomen: No acute abnormality. Musculoskeletal: No chest wall abnormality. No acute or significant osseous findings. Review of the MIP images confirms the above findings. IMPRESSION: 1. Limited evaluation for distal PE in the lower lobes secondary to respiratory motion artifact. No acute central embolus is seen. 2. Extensive heterogeneous bilateral ground-glass densities, consistent with bilateral pneumonia and given history of COVID positivity. Electronically Signed   By: Donavan Foil M.D.   On: 09/06/2019 21:13    Lab Data:  CBC: Recent Labs  Lab 09/06/19 1605 09/06/19 2049 09/07/19 0410  WBC 8.8  --  9.4  HGB 11.7* 11.2* 10.7*  HCT 37.3 33.0* 34.7*  MCV 85.7  --  86.5  PLT 429*  --  [id number]   Basic Metabolic Panel: Recent Labs  Lab 09/06/19 1605 09/06/19 2049 09/07/19 0410  NA 135 137  --   K 3.8 4.0  --   CL 101  --   --   CO2 22  --   --   GLUCOSE 95  --   --   BUN 8  --   --   CREATININE 0.61  --  0.60  CALCIUM 9.0  --   --    GFR: Estimated Creatinine Clearance: 68 mL/min (by C-G formula based on SCr of 0.6 mg/dL). Liver Function Tests: Recent Labs  Lab 09/07/19 0410  AST 46*  ALT 16  ALKPHOS 50    BILITOT 0.4  PROT 7.3  ALBUMIN 2.8*   No results for input(s): LIPASE, AMYLASE in the last 168 hours. No results for input(s): AMMONIA in the last 168 hours. Coagulation Profile: No results for input(s): INR, PROTIME in the last 168 hours. Cardiac Enzymes: No results for input(s): CKTOTAL, CKMB, CKMBINDEX, TROPONINI in the last 168 hours. BNP (last 3 results) No results for  input(s): PROBNP in the last 8760 hours. HbA1C: No results for input(s): HGBA1C in the last 72 hours. CBG: No results for input(s): GLUCAP in the last 168 hours. Lipid Profile: No results for input(s): CHOL, HDL, LDLCALC, TRIG, CHOLHDL, LDLDIRECT in the last 72 hours. Thyroid Function Tests: No results for input(s): TSH, T4TOTAL, FREET4, T3FREE, THYROIDAB in the last 72 hours. Anemia Panel: Recent Labs    09/07/19 0410  FERRITIN 29   Urine analysis: No results found for: COLORURINE, APPEARANCEUR, LABSPEC, PHURINE, GLUCOSEU, HGBUR, BILIRUBINUR, KETONESUR, PROTEINUR, UROBILINOGEN, NITRITE, Milus Height M.D. [facility] 09/07/2019, 2:45 PM  Pager: [phone] Between 7am to 7pm - call Pager - [phone]  After 7pm go to [email or web address] - password [email or web address]  Call night coverage person covering after 7pm

## 2019-09-07 NOTE — ED Notes (Signed)
MS/PUI Breakfast ordered

## 2019-09-08 ENCOUNTER — Inpatient Hospital Stay (HOSPITAL_COMMUNITY): Payer: BC Managed Care – PPO

## 2019-09-08 DIAGNOSIS — J9601 Acute respiratory failure with hypoxia: Secondary | ICD-10-CM

## 2019-09-08 DIAGNOSIS — Z21 Asymptomatic human immunodeficiency virus [HIV] infection status: Secondary | ICD-10-CM

## 2019-09-08 DIAGNOSIS — J189 Pneumonia, unspecified organism: Secondary | ICD-10-CM

## 2019-09-08 DIAGNOSIS — D649 Anemia, unspecified: Secondary | ICD-10-CM | POA: Diagnosis present

## 2019-09-08 DIAGNOSIS — B20 Human immunodeficiency virus [HIV] disease: Secondary | ICD-10-CM | POA: Diagnosis present

## 2019-09-08 DIAGNOSIS — B59 Pneumocystosis: Secondary | ICD-10-CM

## 2019-09-08 DIAGNOSIS — R590 Localized enlarged lymph nodes: Secondary | ICD-10-CM

## 2019-09-08 DIAGNOSIS — Z9049 Acquired absence of other specified parts of digestive tract: Secondary | ICD-10-CM

## 2019-09-08 DIAGNOSIS — R0602 Shortness of breath: Secondary | ICD-10-CM

## 2019-09-08 LAB — COMPREHENSIVE METABOLIC PANEL
ALT: 13 U/L (ref 0–44)
AST: 51 U/L — ABNORMAL HIGH (ref 15–41)
Albumin: 2.3 g/dL — ABNORMAL LOW (ref 3.5–5.0)
Alkaline Phosphatase: 50 U/L (ref 38–126)
Anion gap: 9 (ref 5–15)
BUN: <5 mg/dL — ABNORMAL LOW (ref 6–20)
CO2: 22 mmol/L (ref 22–32)
Calcium: 8.3 mg/dL — ABNORMAL LOW (ref 8.9–10.3)
Chloride: 105 mmol/L (ref 98–111)
Creatinine, Ser: 0.62 mg/dL (ref 0.44–1.00)
GFR calc Af Amer: >60 mL/min (ref >60)
GFR calc non Af Amer: >60 mL/min (ref >60)
Glucose, Bld: 159 mg/dL — ABNORMAL HIGH (ref 70–99)
Potassium: 3.5 mmol/L (ref 3.5–5.1)
Sodium: 136 mmol/L (ref 135–145)
Total Bilirubin: 0.5 mg/dL (ref 0.3–1.2)
Total Protein: 6.8 g/dL (ref 6.5–8.1)

## 2019-09-08 LAB — BLOOD GAS, ARTERIAL
Acid-base deficit: 1.4 mmol/L (ref 0.0–2.0)
Bicarbonate: 22.5 mmol/L (ref 20.0–28.0)
Drawn by: 57060
FIO2: 52
O2 Saturation: 98.6 %
Patient temperature: 38.8
pCO2 arterial: 39.4 mmHg (ref 32.0–48.0)
pH, Arterial: 7.385 (ref 7.350–7.450)
pO2, Arterial: 130 mmHg — ABNORMAL HIGH (ref 83.0–108.0)

## 2019-09-08 LAB — CBC WITH DIFFERENTIAL/PLATELET
Abs Immature Granulocytes: 0.2 10*3/uL — ABNORMAL HIGH (ref 0.00–0.07)
Basophils Absolute: 0 10*3/uL (ref 0.0–0.1)
Basophils Relative: 0 %
Eosinophils Absolute: 0 10*3/uL (ref 0.0–0.5)
Eosinophils Relative: 0 %
HCT: 31.6 % — ABNORMAL LOW (ref 36.0–46.0)
Hemoglobin: 10.1 g/dL — ABNORMAL LOW (ref 12.0–15.0)
Immature Granulocytes: 1 %
Lymphocytes Relative: 7 %
Lymphs Abs: 1.3 10*3/uL (ref 0.7–4.0)
MCH: 27.1 pg (ref 26.0–34.0)
MCHC: 32 g/dL (ref 30.0–36.0)
MCV: 84.7 fL (ref 80.0–100.0)
Monocytes Absolute: 1.9 10*3/uL — ABNORMAL HIGH (ref 0.1–1.0)
Monocytes Relative: 10 %
Neutro Abs: 14.7 10*3/uL — ABNORMAL HIGH (ref 1.7–7.7)
Neutrophils Relative %: 82 %
Platelets: 356 10*3/uL (ref 150–400)
RBC: 3.73 MIL/uL — ABNORMAL LOW (ref 3.87–5.11)
RDW: 14.9 % (ref 11.5–15.5)
WBC: 18 10*3/uL — ABNORMAL HIGH (ref 4.0–10.5)
nRBC: 0 % (ref 0.0–0.2)

## 2019-09-08 LAB — LACTATE DEHYDROGENASE: LDH: 535 U/L — ABNORMAL HIGH (ref 98–192)

## 2019-09-08 LAB — PROCALCITONIN: Procalcitonin: 0.44 ng/mL

## 2019-09-08 LAB — ECHOCARDIOGRAM COMPLETE
Height: 62 in
Weight: 2112 oz

## 2019-09-08 MED ORDER — SULFAMETHOXAZOLE-TRIMETHOPRIM 400-80 MG PO TABS
1.0000 | ORAL_TABLET | Freq: Two times a day (BID) | ORAL | Status: DC
  Administered 2019-09-08 (×2): 1 via ORAL
  Filled 2019-09-08 (×2): qty 1

## 2019-09-08 MED ORDER — GUAIFENESIN ER 600 MG PO TB12
600.0000 mg | ORAL_TABLET | Freq: Two times a day (BID) | ORAL | Status: DC
  Administered 2019-09-08 – 2019-09-20 (×25): 600 mg via ORAL
  Filled 2019-09-08 (×27): qty 1

## 2019-09-08 MED ORDER — LEVALBUTEROL HCL 0.63 MG/3ML IN NEBU: 0.6300 mg | INHALATION_SOLUTION | Freq: Three times a day (TID) | RESPIRATORY_TRACT | Status: DC | PRN

## 2019-09-08 MED ORDER — METHYLPREDNISOLONE SODIUM SUCC 125 MG IJ SOLR: 60.0000 mg | Freq: Four times a day (QID) | INTRAMUSCULAR | Status: DC

## 2019-09-08 MED ORDER — METHYLPREDNISOLONE SODIUM SUCC 40 MG IJ SOLR
40.0000 mg | Freq: Two times a day (BID) | INTRAMUSCULAR | Status: DC
  Administered 2019-09-08 – 2019-09-11 (×6): 40 mg via INTRAVENOUS
  Filled 2019-09-08 (×6): qty 1

## 2019-09-08 NOTE — Progress Notes (Signed)
[facility]                                                                              Patient Demographics  Deborah Durham, is a 49 y.o. female, DOB - 1971/03/31, ZC:[id number]  Admit date - 09/06/2019   Admitting Physician Gwynne Edinger, MD  Outpatient Primary MD for the patient is Patient, No Pcp Per  Outpatient specialists:   LOS - 2  days    Chief Complaint  Patient presents with  . Shortness of Breath       Brief summary  Deborah Durham is a 49 y.o. female with no significantmedical history  Presenting with more than 2 history of nonproductive cough associated with fever, chills, decreased appetite easy  Fatigability and 1 episode of diarrhea.  No history of known exposure to COVID 19.   Patient has had 3- COVID-19 testing 1 at work, another 2 days prior to presentation and under on the day of presentation.    Patient was tachypneic and hypoxic at the ED with chest x-ray indicating ground glass opacifications concerning for COVID-19 infection.    Assessment & Plan    Principal Problem:   CAP (community acquired pneumonia) Active Problems:   Hypoxia   Acute hypoxic respiratory failure  Due to  Respiratory infection-  Aypical versus viral pneumonia Respiratory management Pulmonary toileting measures  Supportive care Pulmonary medicine consulted Community acquired pneumonia   hypoxic and tachypneic on presentation COVID-19 negative on admission (has had 3- testing historically per patient) Chest x-ray with  Patchy ground-glass opacifications  CT angiogram negative for PE  no evidence of DVT   Episode of hypoxia with O2 sat in the 70s, tachypneic with good response 2 nonbleeding mask IV steroids and switched back O2 by nasal cannula.   Pulmonary medicine consulted HIV screen positive - HIV antibody testing was negative 3-4 months ago -makes advanced HIV with PCP pneumonia unlikely though concerning.  Would continue current IV  antibiotics with Infectious Disease follow-up Supplemental oxygen Incentive spirometry  Supportive care  Normocytic anemia with thrombocytosis  Mild Follow clinically   Code Status: full code DVT Prophylaxis:  Lovenox   Family Communication: Discussed in detail with the patient, all imaging results, lab results explained to the patient    Disposition Plan: Home  Time Spent in minutes  35 minutes  Procedures:    Consultants:    pulmonary medicine  Infectious disease  Antimicrobials:      Medications  Scheduled Meds: . enoxaparin (LOVENOX) injection  40 mg Subcutaneous Q24H   Continuous Infusions: . azithromycin 500 mg (09/07/19 2247)  . cefTRIAXone (ROCEPHIN)  IV 1 g (09/07/19 2213)  . dextrose 5 % and 0.45% NaCl 100 mL/hr at 09/08/19 0953   PRN Meds:.acetaminophen **OR** acetaminophen   Antibiotics   Anti-infectives (From admission, onward)   Start     Dose/Rate Route Frequency Ordered Stop   09/07/19 2200  azithromycin (ZITHROMAX) 500 mg in sodium chloride 0.9 % 250 mL IVPB     500 mg 250 mL/hr over 60 Minutes Intravenous Every 24 hours 09/07/19 0307 09/09/19 2159   09/07/19 2200  cefTRIAXone (ROCEPHIN)  1 g in sodium chloride 0.9 % 100 mL IVPB     1 g 200 mL/hr over 30 Minutes Intravenous Every 24 hours 09/07/19 0307     09/06/19 2200  cefTRIAXone (ROCEPHIN) 1 g in sodium chloride 0.9 % 100 mL IVPB     1 g 200 mL/hr over 30 Minutes Intravenous  Once 09/06/19 2133 09/07/19 0045   09/06/19 2200  azithromycin (ZITHROMAX) 500 mg in sodium chloride 0.9 % 250 mL IVPB     500 mg 250 mL/hr over 60 Minutes Intravenous  Once 09/06/19 2133 09/07/19 0157        Subjective:   Deborah Durham was seen and examined today.   patient acute episode of tachypnea and hypoxia requiring home 100% non-rebreather mask.  She had had difficulty expectorating and Mucinex with IV steroids initiated with good response.  Appreciate rapid response team  Objective:    Vitals:   09/08/19 0430 09/08/19 0934 09/08/19 1009 09/08/19 1015  BP:  132/78 (!) 142/91   Pulse:  95 (!) 102 (!) 105  Resp:  18 19 (!) 70  Temp: 100.2 F (37.9 C) (!) 101.4 F (38.6 C)    TempSrc: Oral Oral    SpO2:  90% 97% 100%  Weight:      Height:        Intake/Output Summary (Last 24 hours) at 09/08/2019 1038 Last data filed at 09/08/2019 0310 Gross per 24 hour  Intake 640 ml  Output 350 ml  Net 290 ml     Wt Readings from Last 3 Encounters:  09/06/19 59.9 kg     Exam  General: NAD  HEENT: NCAT,  PERRL,MMM  Neck: SUPPLE, (-) JVD  Cardiovascular: RRR, (-) GALLOP, (-) MURMUR  Respiratory:  Bilateral Wheezing  With rhonchi  Gastrointestinal: SOFT, (-) DISTENSION, BS(+), (_) TENDERNESS  Ext: (-) CYANOSIS, (-) EDEMA  Neuro: A, OX 3  Skin:(-) RASH  Psych:NORMAL AFFECT/MOOD   Data Reviewed:  I have personally reviewed following labs and imaging studies  Micro Results Recent Results (from the past 240 hour(s))  Novel Coronavirus, NAA ([facility])     Status: None   Collection Time: 09/04/19  9:15 AM   Specimen: Nasopharyngeal(NP) swabs in vial transport medium   NASOPHARYNGE  TESTING  Result Value Ref Range Status   SARS-CoV-2, NAA Not Detected Not Detected Final    Comment: This nucleic acid amplification test was developed and its performance characteristics determined by [employer]. Nucleic acid amplification tests include PCR and TMA. This test has not been FDA cleared or approved. This test has been authorized by FDA under an Emergency Use Authorization (EUA). This test is only authorized for the duration of time the declaration that circumstances exist justifying the authorization of the emergency use of in vitro diagnostic tests for detection of SARS-CoV-2 virus and/or diagnosis of COVID-19 infection under section 564(b)(1) of the Act, 21 U.S.C. [id number]) (1), unless the authorization is terminated or revoked sooner. When  diagnostic testing is negative, the possibility of a false negative result should be considered in the context of a patient's recent exposures and the presence of clinical signs and symptoms consistent with COVID-19. An individual without symptoms of COVID-19 and who is not shedding SARS-CoV-2 virus would  expect to have a negative (not detected) result in this assay.   SARS CORONAVIRUS 2 (TAT 6-24 HRS) Nasopharyngeal Nasopharyngeal Swab     Status: None   Collection Time: 09/07/19 12:07 AM   Specimen: Nasopharyngeal Swab  Result Value Ref  Range Status   SARS Coronavirus 2 NEGATIVE NEGATIVE Final    Comment: (NOTE) SARS-CoV-2 target nucleic acids are NOT DETECTED. The SARS-CoV-2 RNA is generally detectable in upper and lower respiratory specimens during the acute phase of infection. Negative results do not preclude SARS-CoV-2 infection, do not rule out co-infections with other pathogens, and should not be used as the sole basis for treatment or other patient management decisions. Negative results must be combined with clinical observations, patient history, and epidemiological information. The expected result is Negative. Fact Sheet for Patients: SugarRoll.be Fact Sheet for Healthcare Providers: https://www.woods-mathews.com/ This test is not yet approved or cleared by the [place] FDA and  has been authorized for detection and/or diagnosis of SARS-CoV-2 by FDA under an Emergency Use Authorization (EUA). This EUA will remain  in effect (meaning this test can be used) for the duration of the COVID-19 declaration under Section 56 4(b)(1) of the Act, 21 U.S.C. section 360bbb-3(b)(1), unless the authorization is terminated or revoked sooner. Performed at [facility], [place] [address] [place], [place] [address]   SARS CORONAVIRUS 2 (TAT 6-24 HRS) Nasopharyngeal Nasopharyngeal Swab     Status: None   Collection Time: 09/07/19  3:47 PM     Specimen: Nasopharyngeal Swab  Result Value Ref Range Status   SARS Coronavirus 2 NEGATIVE NEGATIVE Final    Comment: (NOTE) SARS-CoV-2 target nucleic acids are NOT DETECTED. The SARS-CoV-2 RNA is generally detectable in upper and lower respiratory specimens during the acute phase of infection. Negative results do not preclude SARS-CoV-2 infection, do not rule out co-infections with other pathogens, and should not be used as the sole basis for treatment or other patient management decisions. Negative results must be combined with clinical observations, patient history, and epidemiological information. The expected result is Negative. Fact Sheet for Patients: SugarRoll.be Fact Sheet for Healthcare Providers: https://www.woods-mathews.com/ This test is not yet approved or cleared by the [place] FDA and  has been authorized for detection and/or diagnosis of SARS-CoV-2 by FDA under an Emergency Use Authorization (EUA). This EUA will remain  in effect (meaning this test can be used) for the duration of the COVID-19 declaration under Section 56 4(b)(1) of the Act, 21 U.S.C. section 360bbb-3(b)(1), unless the authorization is terminated or revoked sooner. Performed at [facility], [place] [address] [place], [place] [address]     Radiology Reports DG Chest 2 View  Result Date: 09/06/2019 CLINICAL DATA:  Shortness of breath EXAM: CHEST - 2 VIEW COMPARISON:  None. FINDINGS: The heart size and mediastinal contours are within normal limits. Bilateral heterogeneous airspace opacity, most conspicuous in the bilateral lung bases. The visualized skeletal structures are unremarkable. IMPRESSION: Bilateral heterogeneous airspace opacity, most conspicuous in the bilateral lung bases, consistent with multifocal infection and COVID-19 infection, if suspected. Electronically Signed   By: Eddie Candle M.D.   On: 09/06/2019 14:32   CT Angio Chest PE W  and/or Wo Contrast  Result Date: 09/06/2019 CLINICAL DATA:  Shortness of breath recent COVID diagnosis EXAM: CT ANGIOGRAPHY CHEST WITH CONTRAST TECHNIQUE: Multidetector CT imaging of the chest was performed using the standard protocol during bolus administration of intravenous contrast. Multiplanar CT image reconstructions and MIPs were obtained to evaluate the vascular anatomy. CONTRAST:  76 mL OMNIPAQUE IOHEXOL 350 MG/ML SOLN COMPARISON:  Chest x-ray 09/06/2019 FINDINGS: Cardiovascular: Satisfactory opacification of the pulmonary arteries to the segmental level by density measurements though overall noisy appearance of the images. Limited evaluation for subsegmental PE in the  lower lobes secondary to respiratory motion artifact. Nonaneurysmal aorta. No dissection seen. Normal heart size. No pericardial effusion. Mediastinum/Nodes: No enlarged mediastinal, hilar, or axillary lymph nodes. Thyroid gland, trachea, and esophagus demonstrate no significant findings. Lungs/Pleura: Extensive heterogeneous bilateral ground-glass density. No pleural effusion or pneumothorax. Upper Abdomen: No acute abnormality. Musculoskeletal: No chest wall abnormality. No acute or significant osseous findings. Review of the MIP images confirms the above findings. IMPRESSION: 1. Limited evaluation for distal PE in the lower lobes secondary to respiratory motion artifact. No acute central embolus is seen. 2. Extensive heterogeneous bilateral ground-glass densities, consistent with bilateral pneumonia and given history of COVID positivity. Electronically Signed   By: Donavan Foil M.D.   On: 09/06/2019 21:13    Lab Data:  CBC: Recent Labs  Lab 09/06/19 1605 09/06/19 2049 09/07/19 0410  WBC 8.8  --  9.4  HGB 11.7* 11.2* 10.7*  HCT 37.3 33.0* 34.7*  MCV 85.7  --  86.5  PLT 429*  --  [id number]   Basic Metabolic Panel: Recent Labs  Lab 09/06/19 1605 09/06/19 2049 09/07/19 0410  NA 135 137  --   K 3.8 4.0  --   CL 101  --   --    CO2 22  --   --   GLUCOSE 95  --   --   BUN 8  --   --   CREATININE 0.61  --  0.60  CALCIUM 9.0  --   --    GFR: Estimated Creatinine Clearance: 68 mL/min (by C-G formula based on SCr of 0.6 mg/dL). Liver Function Tests: Recent Labs  Lab 09/07/19 0410  AST 46*  ALT 16  ALKPHOS 50  BILITOT 0.4  PROT 7.3  ALBUMIN 2.8*   No results for input(s): LIPASE, AMYLASE in the last 168 hours. No results for input(s): AMMONIA in the last 168 hours. Coagulation Profile: No results for input(s): INR, PROTIME in the last 168 hours. Cardiac Enzymes: No results for input(s): CKTOTAL, CKMB, CKMBINDEX, TROPONINI in the last 168 hours. BNP (last 3 results) No results for input(s): PROBNP in the last 8760 hours. HbA1C: No results for input(s): HGBA1C in the last 72 hours. CBG: No results for input(s): GLUCAP in the last 168 hours. Lipid Profile: No results for input(s): CHOL, HDL, LDLCALC, TRIG, CHOLHDL, LDLDIRECT in the last 72 hours. Thyroid Function Tests: No results for input(s): TSH, T4TOTAL, FREET4, T3FREE, THYROIDAB in the last 72 hours. Anemia Panel: Recent Labs    09/07/19 0410  FERRITIN 29   Urine analysis: No results found for: COLORURINE, APPEARANCEUR, LABSPEC, PHURINE, GLUCOSEU, HGBUR, BILIRUBINUR, KETONESUR, PROTEINUR, UROBILINOGEN, NITRITE, Milus Height M.D. [facility] 09/08/2019, 10:38 AM  Pager: [phone] Between 7am to 7pm - call Pager - [phone]  After 7pm go to [email or web address] - password [email or web address]  Call night coverage person covering after 7pm

## 2019-09-08 NOTE — Consult Note (Signed)
PULMONARY / CRITICAL CARE MEDICINE   NAME:  Deborah Durham, MRN:  [id number], DOB:  Jan 10, 1971, LOS: 2 ADMISSION DATE:  09/06/2019, CONSULTATION DATE:  01/10 REFERRING MD:  Dr. Vista Lawman, CHIEF COMPLAINT:  Dyspnea and fever   BRIEF HISTORY:    49 yo F presented with viral syndrome and noted to have worsening hypoxia. She is COVID negative x several tests. PCCM consulted due to increasing supplemental oxygen demand   HISTORY OF PRESENT ILLNESS   Deborah Durham is a 49 yo female absent a significant PMHx who presented for evaluation of shortness of breath. Patient notes she first became SOB all of the sudden when walking to her car after work 2 weeks ago. Since then, ~two weeks of fever, chills, dry cough, fatigue, decreased appetite, as well as marked dyspnea on exertion.She has had 3-4 negative COVID tests of which all results are not available to [place]. Her flu test was also negative this admission. No one in her family has had symptoms and have tested negative.   She has been treated for CAP with CTX and azithromycin but has progressively worsening symptoms. She is COVID, influenza, strep urinary ag negative. Legionella pending. No RVP panel completed. CTA PE study negative for PE. Majority of inflammatory markers not significantly elevated as would be expected in COVID-19 patients with a CRP 8.9, D-dimer 0.75, ferritin 29, Procalcitonin 0.12, CBC 9.4 (no diff completed). HIV screening positive, rlx sent.   SIGNIFICANT PAST MEDICAL HISTORY   Denied PMHx  SIGNIFICANT EVENTS:  Marked increased supplemental O2 demand on 01/10  STUDIES:   CT angio PE study: IMPRESSION: 1. Limited evaluation for distal PE in the lower lobes secondary to respiratory motion artifact. No acute central embolus is seen. 2. Extensive heterogeneous bilateral ground-glass densities, consistent with bilateral pneumonia and given history of COVID positivity.  CULTURES:  N/A  ANTIBIOTICS:  CTX  01/08>> Azithromycin 01/08 >> Bactrim 01/10 >>   LINES/TUBES:  PIV 01/08 >>  CONSULTANTS:  PCCM  SUBJECTIVE:  Sudden onset of symptoms. Lives at home with asymptomatic spouse and kids.   CONSTITUTIONAL: BP (!) 142/91   Pulse (!) 105   Temp (!) 101.4 F (38.6 C) (Oral)   Resp 19   Ht 5\' 2"  (1.575 m)   Wt 59.9 kg   LMP 08/06/2019   SpO2 100%   BMI 24.14 kg/m   I/O last 3 completed shifts: In: 640 [P.O.:240; I.V.:300; IV Piggyback:100] Out: 350 [Urine:350]       PHYSICAL EXAM: General:  NAD,  Neuro:  A&Ox4, Moving all 4 extremities HEENT:  NCAT, sclera antiicteric  Cardiovascular:  Tachycardic, regular rhythm , no murmurs, rubs, or gallops Lungs:  Poor air movement, shallow breathing.  Abdomen:  Soft, nontender, bowel sounds normal Musculoskeletal:  BLE nontender, nonedematous Skin:  Normal skin color, no rash or erythema noted  RESOLVED PROBLEM LIST   ASSESSMENT AND PLAN   Hypoxemia likely 2/2 to Viral lower respiratory infection vs CAP: Viral LRI seems most likely. COVID negative x ~4 tests both here and prior to admission. Screening HIV positive which reflexed, awaiting results. PJP vs other oportunistic infection possible if HIV confirmed. In the setting of increasing supplemental oxygen demand we will empirically treat for this while awaiting confirmatory HIV results as she has not improved with CAP coverage w/in 24-48 hours.   Plan: Agree with incentive spirometry and flutter valve Continue Supplemental O2 with high flow PRN to keep sPo2 >92% ABG ordered by primary RVP ordered Diff added to this  mornings CBC Start Bactrim for PJP treatment Agree with IV solumedrol Q12 hours  CD4+/8+ count ordered to better guide therapy if HIV confirmed  HIV screening positive: Screening positive. Low risk history. Test result reflexed per chart.   Plan: ID consulted.  CD4 ordered presumptively due to severity of illness and prolonged wait times for CD4 to return if  HIV confirmed.    SUMMARY OF TODAY'S PLAN:  Would recommend continuing current therapy and following up on RVP panel. Consider BiPAP PRN if she continues to desaturate on [place]/Venti mask.   Best Practice / Goals of Care / Disposition.   DVT PROPHYLAXIS: Lovenox 40mg  q24 SUP: Not indicated  NUTRITION: NPO MOBILITY: Up with assistance  GOALS OF CARE: Full scope of treatment FAMILY DISCUSSIONS: Discussed care with patient DISPOSITION: Will Would consider High flow vs BiPAP pending ABG results  LABS  Glucose No results for input(s): GLUCAP in the last 168 hours.  BMET Recent Labs  Lab 09/06/19 1605 09/06/19 2049 09/07/19 0410  NA 135 137  --   K 3.8 4.0  --   CL 101  --   --   CO2 22  --   --   BUN 8  --   --   CREATININE 0.61  --  0.60  GLUCOSE 95  --   --    Liver Enzymes Recent Labs  Lab 09/07/19 0410  AST 46*  ALT 16  ALKPHOS 50  BILITOT 0.4  ALBUMIN 2.8*   Electrolytes Recent Labs  Lab 09/06/19 1605  CALCIUM 9.0   CBC Recent Labs  Lab 09/06/19 1605 09/06/19 2049 09/07/19 0410  WBC 8.8  --  9.4  HGB 11.7* 11.2* 10.7*  HCT 37.3 33.0* 34.7*  PLT 429*  --  400   ABG No results for input(s): PHART, PCO2ART, PO2ART in the last 168 hours.  Coag's No results for input(s): APTT, INR in the last 168 hours.  Sepsis Markers Recent Labs  Lab 09/07/19 0410  PROCALCITON 0.12   Cardiac Enzymes No results for input(s): TROPONINI, PROBNP in the last 168 hours.  PAST MEDICAL HISTORY :   She  has no past medical history on file.  PAST SURGICAL HISTORY:  She  has a past surgical history that includes Cesarean section and Cholecystectomy.  No Known Allergies  No current facility-administered medications on file prior to encounter.   Current Outpatient Medications on File Prior to Encounter  Medication Sig  . acetaminophen (TYLENOL) 500 MG tablet Take 1,000 mg by mouth every 6 (six) hours as needed for fever or headache.  . guaiFENesin (MUCINEX) 600 MG  12 hr tablet Take 1,200 mg by mouth 2 (two) times daily as needed for cough or to loosen phlegm.  . Multiple Vitamins-Minerals (ALIVE WOMENS GUMMY PO) Take 1 tablet by mouth daily.   FAMILY HISTORY:   Her family history is not on file.  SOCIAL HISTORY:  She  reports that she has never smoked. She has never used smokeless tobacco. She reports that she does not drink alcohol or use drugs.  REVIEW OF SYSTEMS:    General: Endorsed fever and shortness of breath HENT: No sinus drainage or hearing loss Eyes: Denied drainage or redness Cardio: No palpitations or chest pain Pulmonary: Dyspnea most notable on standing/walking, no wheezing GU: Denied hematuria or dysuria ABD: Constipation, no recurrent symptoms or abd pain MSK: No joint pain or muscle aches Skin: Denied rash or redness Psych: Patient endorses anxiety surrounding admission

## 2019-09-08 NOTE — Progress Notes (Signed)
  Echocardiogram 2D Echocardiogram has been performed.  Deborah Durham 09/08/2019, 4:09 PM

## 2019-09-08 NOTE — Consult Note (Signed)
[facility] for Infectious Disease    Date of Admission:  09/06/2019           Day 3 ceftriaxone        Day 3 azithromycin        Day 1 trimethoprim sulfamethoxazole and steroids       Reason for Consult: HIV screening test positive    Referring Provider: CCM  Assessment: The cause of her acute pneumonia remains uncertain.  Her presentation is certainly compatible with COVID-19 but repetitive negative testing makes that unlikely.  With the positive HIV screening test I agree with concerns about the possibility of pneumocystis pneumonia but her history of a negative HIV antibody last October and lack of other warning signs for advanced HIV give me hope that the test is falsely positive.  Her total lymphocyte count is normal.  Once her CD4 count is back that should help sort this out.  She says she is feeling better since steroids were started this morning and she is sitting straight up in bed.  I will continue her current antibiotic regimen and steroids and follow-up with her tomorrow.  I did tell her that the screening test was positive.  I told her that this does not mean that she is HIV infected.  Further testing will be required.  Plan: 1. Continue current antibiotics and steroids 2. 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 enoxaparin (LOVENOX) injection  40 mg Subcutaneous Q24H  . guaiFENesin  600 mg Oral BID  . methylPREDNISolone (SOLU-MEDROL) injection  40 mg Intravenous Q12H  . sulfamethoxazole-trimethoprim  1 tablet Oral Q12H   Continuous Infusions: . azithromycin 500 mg (09/07/19 2247)  . cefTRIAXone (ROCEPHIN)  IV 1 g (09/07/19 2213)  . dextrose 5 % and 0.45% NaCl 100 mL/hr at 09/08/19 0953   PRN Meds:.acetaminophen **OR** acetaminophen, levalbuterol  HPI:  Deborah Durham is a 49 y.o. female who has been in excellent health throughout her life until Christmas eve when she noticed some dyspnea on exertion while walking to her car.  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  She was tested negative.  She kept having worsening dyspnea on exertion, dry cough, fatigue and anorexia.  She had repeat tested for COVID-19 and was negative again.  She came to the ED on 09/06/2019 where she had a temperature of 102.9 degrees.  Chest x-ray revealed patchy bilateral infiltrates.  Repeat COVID-19 antigen and PCR testing was negative again.  She was on empiric therapy for community-acquired pneumonia.  She has developed progressive hypoxia.  CT scan showed diffuse, bilateral opacities.  An HIV fourth-generation screening test was positive.  Confirmatory testing is pending.  She denies any significant medical problems in the past except for needing a cholecystectomy.  She has no history of hepatitis or sexually transmitted diseases.  She is married and has 2 children.  She was separated from her husband but they reconciled.late last year there moved from [place] to [place].  They both tested negative for HIV at that time.   Review of Systems: Review of Systems  Constitutional: Positive for chills, fever, malaise/fatigue and weight loss. Negative for diaphoresis.       She has lost  about 10 pounds during this illness.  HENT: Negative for congestion and sore throat.   Respiratory: Positive for cough and shortness of breath. Negative for hemoptysis, sputum production and wheezing.   Cardiovascular: Negative for chest pain.  Gastrointestinal: Negative for abdominal pain, diarrhea, nausea and vomiting.  Genitourinary: Negative for dysuria.  Musculoskeletal: Negative for myalgias.  Skin: Negative for rash.  Neurological: Negative for headaches.  Psychiatric/Behavioral: Negative for substance abuse.    History  reviewed. No pertinent past medical history.  Social History   Tobacco Use  . Smoking status: Never Smoker  . Smokeless tobacco: Never Used  Substance Use Topics  . Alcohol use: Never  . Drug use: Never    No family history on file. No Known Allergies  OBJECTIVE: Blood pressure (!) 142/91, pulse (!) 105, temperature (!) 101.4 F (38.6 C), temperature source Oral, resp. rate 19, height 5\' 2"  (1.575 m), weight 59.9 kg, last menstrual period 08/06/2019, SpO2 100 %.  Physical Exam Constitutional:      Comments: She is very pleasant and in no obvious distress.  She is sitting up in bed.  HENT:     Mouth/Throat:     Pharynx: No oropharyngeal exudate.  Eyes:     Conjunctiva/sclera: Conjunctivae normal.  Cardiovascular:     Rate and Rhythm: Regular rhythm. Tachycardia present.     Heart sounds: No murmur.  Pulmonary:     Effort: Pulmonary effort is normal.     Breath sounds: Normal breath sounds.  Abdominal:     Palpations: Abdomen is soft.     Tenderness: There is no abdominal tenderness.  Musculoskeletal:        General: No swelling or tenderness.  Lymphadenopathy:     Cervical: No cervical adenopathy.     Upper Body:     Right upper body: Axillary adenopathy present. No supraclavicular or epitrochlear adenopathy.     Left upper body: No supraclavicular, axillary or epitrochlear adenopathy.  Skin:    Findings: No rash.  Psychiatric:        Mood and Affect: Mood normal.     Lab Results Lab Results  Component Value Date   WBC 18.0 (H) 09/08/2019   HGB 10.1 (L) 09/08/2019   HCT 31.6 (L) 09/08/2019   MCV 84.7 09/08/2019   PLT 356 09/08/2019    Lab Results  Component Value Date   CREATININE 0.62 09/08/2019   BUN <5 (L) 09/08/2019   NA 136 09/08/2019   K 3.5 09/08/2019   CL 105 09/08/2019   CO2 22 09/08/2019    Lab Results  Component Value Date   ALT 13 09/08/2019   AST 51 (H) 09/08/2019   ALKPHOS 50 09/08/2019   BILITOT 0.5 09/08/2019     Microbiology:  Recent Results (from the past 240 hour(s))  Novel Coronavirus, NAA ([facility])     Status: None   Collection Time: 09/04/19  9:15 AM   Specimen: Nasopharyngeal(NP) swabs in vial transport medium   NASOPHARYNGE  TESTING  Result Value Ref Range Status   SARS-CoV-2, NAA Not Detected Not Detected Final    Comment: This nucleic acid amplification test was developed and its performance characteristics determined by [employer]. Nucleic acid amplification tests include PCR and TMA. This test has not been FDA cleared or approved. This test has been authorized by FDA under an Emergency Use Authorization (EUA). This test is only authorized for the duration of time the declaration that circumstances exist justifying the authorization of the emergency use of in  vitro diagnostic tests for detection of SARS-CoV-2 virus and/or diagnosis of COVID-19 infection under section 564(b)(1) of the Act, 21 U.S.C. [id number]) (1), unless the authorization is terminated or revoked sooner. When diagnostic testing is negative, the possibility of a false negative result should be considered in the context of a patient's recent exposures and the presence of clinical signs and symptoms consistent with COVID-19. An individual without symptoms of COVID-19 and who is not shedding SARS-CoV-2 virus would  expect to have a negative (not detected) result in this assay.   SARS CORONAVIRUS 2 (TAT 6-24 HRS) Nasopharyngeal Nasopharyngeal Swab     Status: None   Collection Time: 09/07/19 12:07 AM   Specimen: Nasopharyngeal Swab  Result Value Ref Range Status   SARS Coronavirus 2 NEGATIVE NEGATIVE Final    Comment: (NOTE) SARS-CoV-2 target nucleic acids are NOT DETECTED. The SARS-CoV-2 RNA is generally detectable in upper and lower respiratory specimens during the acute phase of infection. Negative results do not preclude SARS-CoV-2 infection, do not rule out co-infections with other pathogens, and should not be used  as the sole basis for treatment or other patient management decisions. Negative results must be combined with clinical observations, patient history, and epidemiological information. The expected result is Negative. Fact Sheet for Patients: SugarRoll.be Fact Sheet for Healthcare Providers: https://www.woods-mathews.com/ This test is not yet approved or cleared by the [place] FDA and  has been authorized for detection and/or diagnosis of SARS-CoV-2 by FDA under an Emergency Use Authorization (EUA). This EUA will remain  in effect (meaning this test can be used) for the duration of the COVID-19 declaration under Section 56 4(b)(1) of the Act, 21 U.S.C. section 360bbb-3(b)(1), unless the authorization is terminated or revoked sooner. Performed at [facility], [place] [address] [place], [place] [address]   SARS CORONAVIRUS 2 (TAT 6-24 HRS) Nasopharyngeal Nasopharyngeal Swab     Status: None   Collection Time: 09/07/19  3:47 PM   Specimen: Nasopharyngeal Swab  Result Value Ref Range Status   SARS Coronavirus 2 NEGATIVE NEGATIVE Final    Comment: (NOTE) SARS-CoV-2 target nucleic acids are NOT DETECTED. The SARS-CoV-2 RNA is generally detectable in upper and lower respiratory specimens during the acute phase of infection. Negative results do not preclude SARS-CoV-2 infection, do not rule out co-infections with other pathogens, and should not be used as the sole basis for treatment or other patient management decisions. Negative results must be combined with clinical observations, patient history, and epidemiological information. The expected result is Negative. Fact Sheet for Patients: SugarRoll.be Fact Sheet for Healthcare Providers: https://www.woods-mathews.com/ This test is not yet approved or cleared by the [place] FDA and  has been authorized for detection and/or diagnosis of  SARS-CoV-2 by FDA under an Emergency Use Authorization (EUA). This EUA will remain  in effect (meaning this test can be used) for the duration of the COVID-19 declaration under Section 56 4(b)(1) of the Act, 21 U.S.C. section 360bbb-3(b)(1), unless the authorization is terminated or revoked sooner. Performed at [facility], [place] [address] [place], [place] [address]     Michel Bickers, [facility] [phone] pager   [phone] cell 09/08/2019, 3:46 PM

## 2019-09-08 NOTE — Significant Event (Signed)
Rapid Response Event Note  Overview: Time Called: 1005 Arrival Time: 1007 Event Type: Respiratory  Initial Focused Assessment: Patient admitted with SOB, PNA She has been mildly tachypnic overnight RR 20s-40 Her RR is now 70 and desated on [place] requiring NRB  BP 142/91  ST 102  RR 70  O2 sat 100% on NRB Very shallow breathing   Temp 101.4  She is alert and appears mildly anxious/tearful.  No increase WOB  Interventions: Coached/taught to purse lip breath. Encouraged her to slow her breathing. Placed in "Chair position" in the bed.  She is able to slow her breathing down again to the rate she was overnight.  RR 20-45 Able to wean O2 to HFNC 8L She is able to speak in complete sentences without desat.  Dr Vista Lawman at bedside to assess patient.   Plan of Care (if not transferred): CCM consult Flutter valve/ IS Pulmonary toilet  Event Summary: Name of Physician Notified: Benito Mccreedy at 25  Name of Consulting Physician Notified: CCM - Lenice Llamas at 1015  Outcome: Stayed in room and stabalized  Event End Time: [place]  Raliegh Ip

## 2019-09-09 ENCOUNTER — Encounter (HOSPITAL_COMMUNITY): Payer: Self-pay | Admitting: Obstetrics and Gynecology

## 2019-09-09 DIAGNOSIS — R0902 Hypoxemia: Secondary | ICD-10-CM

## 2019-09-09 LAB — PANEL 083904
HIV 1 AB: POSITIVE
HIV 2 AB: NEGATIVE

## 2019-09-09 LAB — COMPREHENSIVE METABOLIC PANEL
ALT: 14 U/L (ref 0–44)
AST: 34 U/L (ref 15–41)
Albumin: 2.1 g/dL — ABNORMAL LOW (ref 3.5–5.0)
Alkaline Phosphatase: 48 U/L (ref 38–126)
Anion gap: 9 (ref 5–15)
BUN: 5 mg/dL — ABNORMAL LOW (ref 6–20)
CO2: 24 mmol/L (ref 22–32)
Calcium: 8.3 mg/dL — ABNORMAL LOW (ref 8.9–10.3)
Chloride: 105 mmol/L (ref 98–111)
Creatinine, Ser: 0.52 mg/dL (ref 0.44–1.00)
GFR calc Af Amer: >60 mL/min (ref >60)
GFR calc non Af Amer: >60 mL/min (ref >60)
Glucose, Bld: 164 mg/dL — ABNORMAL HIGH (ref 70–99)
Potassium: 3.7 mmol/L (ref 3.5–5.1)
Sodium: 138 mmol/L (ref 135–145)
Total Bilirubin: 0.1 mg/dL — ABNORMAL LOW (ref 0.3–1.2)
Total Protein: 6.5 g/dL (ref 6.5–8.1)

## 2019-09-09 LAB — CBC
HCT: 32 % — ABNORMAL LOW (ref 36.0–46.0)
Hemoglobin: 9.9 g/dL — ABNORMAL LOW (ref 12.0–15.0)
MCH: 26.5 pg (ref 26.0–34.0)
MCHC: 30.9 g/dL (ref 30.0–36.0)
MCV: 85.8 fL (ref 80.0–100.0)
Platelets: 358 10*3/uL (ref 150–400)
RBC: 3.73 MIL/uL — ABNORMAL LOW (ref 3.87–5.11)
RDW: 15.1 % (ref 11.5–15.5)
WBC: 9.5 10*3/uL (ref 4.0–10.5)
nRBC: 0 % (ref 0.0–0.2)

## 2019-09-09 LAB — RESPIRATORY PANEL BY PCR

## 2019-09-09 LAB — CD4/CD8 (T-HELPER/T-SUPPRESSOR CELL)
CD4 absolute: <35 /uL — ABNORMAL LOW (ref 400–1790)
CD4%: 3 % — ABNORMAL LOW (ref 33–65)
CD8 T Cell Abs: 376 /uL (ref 190–1000)
CD8tox: 45 % — ABNORMAL HIGH (ref 12–40)
Ratio: 0.06 — ABNORMAL LOW (ref 1.0–3.0)
Total lymphocyte count: 842 /uL — ABNORMAL LOW (ref 1000–4000)

## 2019-09-09 LAB — HEPATITIS C ANTIBODY: HCV Ab: NONREACTIVE

## 2019-09-09 MED ORDER — BICTEGRAVIR-EMTRICITAB-TENOFOV 50-200-25 MG PO TABS
1.0000 | ORAL_TABLET | Freq: Every day | ORAL | Status: DC
  Administered 2019-09-09 – 2019-09-20 (×11): 1 via ORAL
  Filled 2019-09-09 (×13): qty 1

## 2019-09-09 MED ORDER — SODIUM CHLORIDE 0.9% FLUSH
10.0000 mL | Freq: Two times a day (BID) | INTRAVENOUS | Status: DC
  Administered 2019-09-09 – 2019-09-20 (×20): 10 mL

## 2019-09-09 MED ORDER — SODIUM CHLORIDE 0.9% FLUSH
10.0000 mL | INTRAVENOUS | Status: DC | PRN
  Administered 2019-09-10 – 2019-09-19 (×2): 10 mL

## 2019-09-09 MED ORDER — SULFAMETHOXAZOLE-TRIMETHOPRIM 400-80 MG/5ML IV SOLN
400.0000 mg | Freq: Three times a day (TID) | INTRAVENOUS | Status: DC
  Administered 2019-09-09 – 2019-09-15 (×18): 400 mg via INTRAVENOUS
  Filled 2019-09-09 (×24): qty 25

## 2019-09-09 NOTE — Progress Notes (Signed)
PROGRESS NOTE    DE MINK    Code Status: Full Code  ZC:[id number] DOB: November 25, 1970 DOA: 09/06/2019  PCP: Patient, No Pcp Per    Hospital Summary  This is a 49 year old female with a 2-week history of shortness of breath, subjective fevers and decreased appetite with DOE with multiple negative Covid tests prior to admission and on admission.  Negative respiratory panel.  Found to have positive HIV antigen and multifocal pneumonia on CT scan.  Started on empiric treatment for pneumocystis pneumonia by pulmonology with Solu-Medrol 40 mg IV twice daily as well as Bactrim and continued on empiric CAP therapy with Rocephin and azithromycin.  ID consulted for HIV eval.    1/11: Per ID, HIV 1 antibody positive and low CD4 count confirms advanced HIV which has been complicated by suspected pneumocystis pneumonia.  Bactrim and steroids continued, started on Biktarvy and urine and blood work ordered for complete baseline work-up for HIV.  CAP Therapy discontinued.  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 Acute hypoxemic respiratory failure suspect secondary to pneumocystis pneumonia in setting of newly diagnosed HIV a. Multiple negative COVID-19 tests as well as negative respiratory viral panel b. Improving O2 requirements currently 4 L/min HFNC c. LDH elevated, consistent with P JP d. Rheumatology work-up ordered by pulmonology prior to positive result HIV confirmatory testing, will follow up e. Rocephin and azithromycin discontinued by ID given below findings 2. Newly diagnosed HIV, suspect complicated by pneumocystis pneumonia a. Screening HIV antigen positive, HIV 1 antibody positive, Low CD4 count b. ID to perform baseline work-up and continue Bactrim and steroids and start Biktarvy 3. Trace pericardial effusion a. Monitor outpatient  DVT  prophylaxis: Lovenox Family Communication: Discussed plan with patient at bedside Disposition Plan: Barrier to discharge is respiratory status, likely discharge in 1 to 2 days and may need O2.  Will consult TOC team  Consultants  ID Pulmonology   Procedures  None  Antibiotics   Anti-infectives (From admission, onward)   Start     Dose/Rate Route Frequency Ordered Stop   09/09/19 1630  bictegravir-emtricitabine-tenofovir AF (BIKTARVY) 50-200-25 MG per tablet 1 tablet     1 tablet Oral Daily 09/09/19 1534     09/09/19 0900  sulfamethoxazole-trimethoprim (BACTRIM) 400 mg of trimethoprim in dextrose 5 % 500 mL IVPB     400 mg of trimethoprim 350 mL/hr over 90 Minutes Intravenous Every 8 hours 09/09/19 0852     09/08/19 1215  sulfamethoxazole-trimethoprim (BACTRIM) 400-80 MG per tablet 1 tablet  Status:  Discontinued     1 tablet Oral Every 12 hours 09/08/19 1206 09/09/19 0852   09/07/19 2200  azithromycin (ZITHROMAX) 500 mg in sodium chloride 0.9 % 250 mL IVPB     500 mg 250 mL/hr over 60 Minutes Intravenous Every 24 hours 09/07/19 0307 09/09/19 0020   09/07/19 2200  cefTRIAXone (ROCEPHIN) 1 g in sodium chloride 0.9 % 100 mL IVPB  Status:  Discontinued     1 g 200 mL/hr over 30 Minutes Intravenous Every 24 hours 09/07/19 0307 09/09/19 1534   09/06/19 2200  cefTRIAXone (ROCEPHIN) 1 g in sodium chloride 0.9 % 100 mL IVPB     1 g 200 mL/hr over 30 Minutes Intravenous  Once 09/06/19 2133 09/07/19 0045   09/06/19 2200  azithromycin (ZITHROMAX) 500 mg in sodium chloride 0.9 %  250 mL IVPB     500 mg 250 mL/hr over 60 Minutes Intravenous  Once 09/06/19 2133 09/07/19 0157           Subjective   Patient seen and examined at bedside in no acute distress and resting comfortably. No acute events overnight. Denies any acute complaints at this time. Tolerating diet well.  Reports her symptoms are improving.  Objective   Vitals:   09/08/19 2216 09/08/19 2300 09/09/19 0800 09/09/19 1200    BP:  110/79 125/81 136/87  Pulse: 88 65 65 74  Resp: 19 20 17 20   Temp:  (!) 97.1 F (36.2 C) 97.8 F (36.6 C)   TempSrc:  Oral Oral   SpO2: 100% 100% 94% 99%  Weight:      Height:        Intake/Output Summary (Last 24 hours) at 09/09/2019 1605 Last data filed at 09/09/2019 1356 Gross per 24 hour  Intake 250 ml  Output 2300 ml  Net -2050 ml   Filed Weights   09/06/19 1552  Weight: 59.9 kg    Examination:  Physical Exam Vitals and nursing note reviewed.  Constitutional:      General: She is not in acute distress. HENT:     Head: Normocephalic.  Cardiovascular:     Rate and Rhythm: Normal rate and regular rhythm.  Pulmonary:     Effort: Pulmonary effort is normal. No tachypnea.     Comments: Nasal cannula in place Abdominal:     Palpations: Abdomen is soft.     Tenderness: There is no guarding.  Musculoskeletal:     Right lower leg: No tenderness. No edema.     Left lower leg: No tenderness. No edema.  Neurological:     General: No focal deficit present.     Mental Status: She is alert.  Psychiatric:        Mood and Affect: Mood normal.        Behavior: Behavior normal.     Data Reviewed: I have personally reviewed following labs and imaging studies  CBC: Recent Labs  Lab 09/06/19 1605 09/06/19 2049 09/07/19 0410 09/08/19 1230 09/09/19 0256  WBC 8.8  --  9.4 18.0* 9.5  NEUTROABS  --   --   --  14.7*  --   HGB 11.7* 11.2* 10.7* 10.1* 9.9*  HCT 37.3 33.0* 34.7* 31.6* 32.0*  MCV 85.7  --  86.5 84.7 85.8  PLT 429*  --  400 356 [id number]   Basic Metabolic Panel: Recent Labs  Lab 09/06/19 1605 09/06/19 2049 09/07/19 0410 09/08/19 1230 09/09/19 0256  NA 135 137  --  136 138  K 3.8 4.0  --  3.5 3.7  CL 101  --   --  105 105  CO2 22  --   --  22 24  GLUCOSE 95  --   --  159* 164*  BUN 8  --   --  <5* 5*  CREATININE 0.61  --  0.60 0.62 0.52  CALCIUM 9.0  --   --  8.3* 8.3*   GFR: Estimated Creatinine Clearance: 68 mL/min (by C-G formula based on  SCr of 0.52 mg/dL). Liver Function Tests: Recent Labs  Lab 09/07/19 0410 09/08/19 1230 09/09/19 0256  AST 46* 51* 34  ALT 16 13 14   ALKPHOS 50 50 48  BILITOT 0.4 0.5 0.1*  PROT 7.3 6.8 6.5  ALBUMIN 2.8* 2.3* 2.1*   No results for input(s): LIPASE, AMYLASE in the last 168  hours. No results for input(s): AMMONIA in the last 168 hours. Coagulation Profile: No results for input(s): INR, PROTIME in the last 168 hours. Cardiac Enzymes: No results for input(s): CKTOTAL, CKMB, CKMBINDEX, TROPONINI in the last 168 hours. BNP (last 3 results) No results for input(s): PROBNP in the last 8760 hours. HbA1C: No results for input(s): HGBA1C in the last 72 hours. CBG: No results for input(s): GLUCAP in the last 168 hours. Lipid Profile: No results for input(s): CHOL, HDL, LDLCALC, TRIG, CHOLHDL, LDLDIRECT in the last 72 hours. Thyroid Function Tests: No results for input(s): TSH, T4TOTAL, FREET4, T3FREE, THYROIDAB in the last 72 hours. Anemia Panel: Recent Labs    09/07/19 0410  FERRITIN 29   Sepsis Labs: Recent Labs  Lab 09/07/19 0410 09/08/19 1230  PROCALCITON 0.12 0.44    Recent Results (from the past 240 hour(s))  Novel Coronavirus, NAA ([facility])     Status: None   Collection Time: 09/04/19  9:15 AM   Specimen: Nasopharyngeal(NP) swabs in vial transport medium   NASOPHARYNGE  TESTING  Result Value Ref Range Status   SARS-CoV-2, NAA Not Detected Not Detected Final    Comment: This nucleic acid amplification test was developed and its performance characteristics determined by [employer]. Nucleic acid amplification tests include PCR and TMA. This test has not been FDA cleared or approved. This test has been authorized by FDA under an Emergency Use Authorization (EUA). This test is only authorized for the duration of time the declaration that circumstances exist justifying the authorization of the emergency use of in vitro diagnostic tests for detection of  SARS-CoV-2 virus and/or diagnosis of COVID-19 infection under section 564(b)(1) of the Act, 21 U.S.C. [id number]) (1), unless the authorization is terminated or revoked sooner. When diagnostic testing is negative, the possibility of a false negative result should be considered in the context of a patient's recent exposures and the presence of clinical signs and symptoms consistent with COVID-19. An individual without symptoms of COVID-19 and who is not shedding SARS-CoV-2 virus would  expect to have a negative (not detected) result in this assay.   SARS CORONAVIRUS 2 (TAT 6-24 HRS) Nasopharyngeal Nasopharyngeal Swab     Status: None   Collection Time: 09/07/19 12:07 AM   Specimen: Nasopharyngeal Swab  Result Value Ref Range Status   SARS Coronavirus 2 NEGATIVE NEGATIVE Final    Comment: (NOTE) SARS-CoV-2 target nucleic acids are NOT DETECTED. The SARS-CoV-2 RNA is generally detectable in upper and lower respiratory specimens during the acute phase of infection. Negative results do not preclude SARS-CoV-2 infection, do not rule out co-infections with other pathogens, and should not be used as the sole basis for treatment or other patient management decisions. Negative results must be combined with clinical observations, patient history, and epidemiological information. The expected result is Negative. Fact Sheet for Patients: SugarRoll.be Fact Sheet for Healthcare Providers: https://www.woods-mathews.com/ This test is not yet approved or cleared by the [place] FDA and  has been authorized for detection and/or diagnosis of SARS-CoV-2 by FDA under an Emergency Use Authorization (EUA). This EUA will remain  in effect (meaning this test can be used) for the duration of the COVID-19 declaration under Section 56 4(b)(1) of the Act, 21 U.S.C. section 360bbb-3(b)(1), unless the authorization is terminated or revoked sooner. Performed at [facility], [place] [address] [place], [place] [address]   SARS CORONAVIRUS 2 (TAT 6-24 HRS) Nasopharyngeal Nasopharyngeal Swab     Status: None   Collection Time: 09/07/19  3:47 PM   Specimen: Nasopharyngeal Swab  Result Value Ref Range Status   SARS Coronavirus 2 NEGATIVE NEGATIVE Final    Comment: (NOTE) SARS-CoV-2 target nucleic acids are NOT DETECTED. The SARS-CoV-2 RNA is generally detectable in upper and lower respiratory specimens during the acute phase of infection. Negative results do not preclude SARS-CoV-2 infection, do not rule out co-infections with other pathogens, and should not be used as the sole basis for treatment or other patient management decisions. Negative results must be combined with clinical observations, patient history, and epidemiological information. The expected result is Negative. Fact Sheet for Patients: SugarRoll.be Fact Sheet for Healthcare Providers: https://www.woods-mathews.com/ This test is not yet approved or cleared by the [place] FDA and  has been authorized for detection and/or diagnosis of SARS-CoV-2 by FDA under an Emergency Use Authorization (EUA). This EUA will remain  in effect (meaning this test can be used) for the duration of the COVID-19 declaration under Section 56 4(b)(1) of the Act, 21 U.S.C. section 360bbb-3(b)(1), unless the authorization is terminated or revoked sooner. Performed at [facility], [place] [address] [place], [place] [address]   Respiratory Panel by PCR     Status: None   Collection Time: 09/08/19 11:43 AM   Specimen: Nasopharyngeal Swab; Respiratory  Result Value Ref Range Status   Adenovirus NOT DETECTED NOT DETECTED Final   Coronavirus 229E NOT DETECTED NOT DETECTED Final    Comment: (NOTE) The Coronavirus on the Respiratory Panel, DOES NOT test for the novel  Coronavirus (2019 nCoV)    Coronavirus HKU1 NOT DETECTED NOT DETECTED Final   Coronavirus  NL63 NOT DETECTED NOT DETECTED Final   Coronavirus OC43 NOT DETECTED NOT DETECTED Final   Metapneumovirus NOT DETECTED NOT DETECTED Final   Rhinovirus / Enterovirus NOT DETECTED NOT DETECTED Final   Influenza A NOT DETECTED NOT DETECTED Final   Influenza B NOT DETECTED NOT DETECTED Final   Parainfluenza Virus 1 NOT DETECTED NOT DETECTED Final   Parainfluenza Virus 2 NOT DETECTED NOT DETECTED Final   Parainfluenza Virus 3 NOT DETECTED NOT DETECTED Final   Parainfluenza Virus 4 NOT DETECTED NOT DETECTED Final   Respiratory Syncytial Virus NOT DETECTED NOT DETECTED Final   Bordetella pertussis NOT DETECTED NOT DETECTED Final   Chlamydophila pneumoniae NOT DETECTED NOT DETECTED Final   Mycoplasma pneumoniae NOT DETECTED NOT DETECTED Final    Comment: Performed at [facility], [place]. [address], [place], [place] [address]         Radiology Studies: DG Chest 1 View  Result Date: 09/08/2019 CLINICAL DATA:  Hypoxia EXAM: CHEST  1 VIEW COMPARISON:  September 06, 2019. FINDINGS: There is airspace opacity in the left lower lung region. The lungs elsewhere are clear. Heart is upper normal in size with pulmonary vascularity within normal limits. No adenopathy. No evident bone lesions. IMPRESSION: Left lower lobe infiltrate consistent with pneumonia. Cardiac silhouette within normal limits. No adenopathy evident. Electronically Signed   By: Lowella Grip III M.D.   On: 09/08/2019 11:10   ECHOCARDIOGRAM COMPLETE  Result Date: 09/08/2019   ECHOCARDIOGRAM REPORT   Patient Name:   AISATOU PENNOYER Date of Exam: 09/08/2019 Medical Rec #:  [id number]            Height:       62.0 in Accession #:    [id number]           Weight:       132.0 lb Date of Birth:  04/03/1971  BSA:          1.60 m Patient Age:    19 years             BP:           142/91 mmHg Patient Gender: F                    HR:           104 bpm. Exam Location:  Inpatient Procedure: 2D Echo Indications:    Dyspnea 786.09/R06.00   History:        Patient has no prior history of Echocardiogram examinations.                 Signs/Symptoms:Shortness of Breath. Orthopnea.  Sonographer:    Clayton Lefort RDCS (AE) Referring Phys: [id number] Benito Mccreedy  Sonographer Comments: Patient sitting at 55 degrees during test due to orthopnea. IMPRESSIONS  1. Left ventricular ejection fraction, by visual estimation, is 60 to 65%. The left ventricle has normal function. There is mildly increased left ventricular hypertrophy. Normal diastolic function.  2. Global right ventricle has normal systolic function.The right ventricular size is normal.  3. Left atrial size was normal.  4. Right atrial size was normal.  5. Trivial pericardial effusion is present.  6. The mitral valve is normal in structure. No evidence of mitral valve regurgitation.  7. The tricuspid valve is normal in structure. Tricuspid valve regurgitation is trivial.  8. The aortic valve is tricuspid. Aortic valve regurgitation is not visualized. No evidence of aortic valve sclerosis or stenosis.  9. The pulmonic valve was not well visualized. Pulmonic valve regurgitation is not visualized. 10. The inferior vena cava is normal in size with <50% respiratory variability, suggesting right atrial pressure of 8 mmHg. 11. The tricuspid regurgitant velocity is 2.66 m/s, and with an assumed right atrial pressure of 8 mmHg, the estimated right ventricular systolic pressure is mildly elevated at 36.3 mmHg. FINDINGS  Left Ventricle: Left ventricular ejection fraction, by visual estimation, is 60 to 65%. The left ventricle has normal function. The left ventricle has no regional wall motion abnormalities. There is mildly increased left ventricular hypertrophy. Left ventricular diastolic parameters were normal. Right Ventricle: The right ventricular size is normal. No increase in right ventricular wall thickness. Global RV systolic function is has normal systolic function. The tricuspid regurgitant velocity is  2.66 m/s, and with an assumed right atrial pressure  of 8 mmHg, the estimated right ventricular systolic pressure is mildly elevated at 36.3 mmHg. Left Atrium: Left atrial size was normal in size. Right Atrium: Right atrial size was normal in size Pericardium: Trivial pericardial effusion is present. Mitral Valve: The mitral valve is normal in structure. No evidence of mitral valve regurgitation. MV peak gradient, 4.7 mmHg. Tricuspid Valve: The tricuspid valve is normal in structure. Tricuspid valve regurgitation is trivial. Aortic Valve: The aortic valve is tricuspid. Aortic valve regurgitation is not visualized. The aortic valve is structurally normal, with no evidence of sclerosis or stenosis. Aortic valve mean gradient measures 5.0 mmHg. Aortic valve peak gradient measures 7.5 mmHg. Aortic valve area, by VTI measures 2.14 cm. Pulmonic Valve: The pulmonic valve was not well visualized. Pulmonic valve regurgitation is not visualized. Pulmonic regurgitation is not visualized. Aorta: The aortic root is normal in size and structure. Venous: The inferior vena cava is normal in size with less than 50% respiratory variability, suggesting right atrial pressure of 8 mmHg. IAS/Shunts: The atrial septum is grossly normal.  LEFT VENTRICLE PLAX 2D LVOT diam:     1.80 cm  Diastology LVOT Area:     2.54 cm LV e' lateral:   11.90 cm/s                         LV E/e' lateral: 7.3                         LV e' medial:    7.51 cm/s                         LV E/e' medial:  11.6  RIGHT VENTRICLE             IVC RV Basal diam:  3.26 cm     IVC diam: 2.02 cm RV S prime:     13.60 cm/s TAPSE (M-mode): 2.4 cm LEFT ATRIUM             Index       RIGHT ATRIUM           Index LA Vol (A2C):   26.7 ml 16.66 ml/m RA Area:     15.30 cm LA Vol (A4C):   25.2 ml 15.73 ml/m RA Volume:   38.30 ml  23.90 ml/m LA Biplane Vol: 26.3 ml 16.41 ml/m  AORTIC VALVE AV Area (Vmax):    1.99 cm AV Area (Vmean):   1.69 cm AV Area (VTI):     2.14 cm AV  Vmax:           137.00 cm/s AV Vmean:          106.000 cm/s AV VTI:            0.240 m AV Peak Grad:      7.5 mmHg AV Mean Grad:      5.0 mmHg LVOT Vmax:         107.00 cm/s LVOT Vmean:        70.200 cm/s LVOT VTI:          0.202 m LVOT/AV VTI ratio: 0.84 MITRAL VALVE                        TRICUSPID VALVE MV Area (PHT): 4.74 cm             TR Peak grad:   28.3 mmHg MV Peak grad:  4.7 mmHg             TR Vmax:        266.00 cm/s MV Mean grad:  2.0 mmHg MV Vmax:       1.08 m/s             SHUNTS MV Vmean:      69.0 cm/s            Systemic VTI:  0.20 m MV VTI:        0.21 m               Systemic Diam: 1.80 cm MV PHT:        46.40 msec MV Decel Time: 160 msec MV E velocity: 87.00 cm/s 103 cm/s MV A velocity: 73.30 cm/s 70.3 cm/s MV E/A ratio:  1.19       1.5  Oswaldo Milian MD Electronically signed by Oswaldo Milian MD Signature Date/Time: 09/08/2019/9:37:03 PM    Final         Scheduled Meds:  bictegravir-emtricitabine-tenofovir AF  1 tablet Oral Daily   enoxaparin (LOVENOX) injection  40 mg Subcutaneous Q24H   guaiFENesin  600 mg Oral BID   methylPREDNISolone (SOLU-MEDROL) injection  40 mg Intravenous Q12H   sodium chloride flush  10-40 mL Intracatheter Q12H   Continuous Infusions:  dextrose 5 % and 0.45% NaCl 100 mL/hr at 09/09/19 1131   sulfamethoxazole-trimethoprim 400 mg of trimethoprim (09/09/19 1138)     LOS: 3 days    Time spent: 25 minutes with over 50% of the time coordinating the patient's care    Harold Hedge, DO [facility] Pager [phone]  If 7PM-7AM, please contact night-coverage [email or web address] Password [facility] 09/09/2019, 4:05 PM

## 2019-09-09 NOTE — Progress Notes (Signed)
  S: Seen in f/u for hypoxemic respiratory failure. Doing well, down to 2L O2. Feeling better. No chest pain or cough. Dyspnea with minimal exertion.   O: Blood pressure 125/81, pulse 65, temperature 97.8 F (36.6 C), temperature source Oral, resp. rate 17, height 5\' 2"  (1.575 m), weight 59.9 kg, last menstrual period 08/06/2019, SpO2 94 %.  Anxious woman in NAD Lungs suprisingly clear No thrush Ext with some mild arthritic changes No peripheral edema Talking in full sentences  A:  # Acute hypoxemic respiratory failure with multifocal GGO- viral panel neg, covid neg multiple times, Pct minimally elevated.  Has + HIV screen but normal lymphocyte count raising question of false positive, awaiting CD4. LDH is high which would be consistent with PJP.  Differential here is viral pneumonitis, inflammatory pneumonitis, PJP pneumonitis or less likely atypical CAP.  Improving with steroids.  P:  - AM rheum workup - f/u CD4 count - DC airborne isolation, she has had 4 negative covid tests and lack of lymphopenia or high ferritin make me have no suspicion at this time. - CAP + Bactrim okay for now, continue steroids - She needs an incentive spirometer and to start working on mobility - Will follow with you  09/09/2019 Erskine Emery MD

## 2019-09-09 NOTE — Progress Notes (Signed)
Patient ID: Deborah Durham, female   DOB: 11-Dec-1970, 49 y.o.   MRN: [id number]          [facility]    Date of Admission:  09/06/2019   Day 1 full dose trimethoprim sulfamethoxazole and steroids  [name]'s HIV 1 antibody is positive.  This along with her very low CD4 count confirms advanced HIV infection almost certainly complicated by pneumocystis pneumonia.  I will continue trimethoprim sulfamethoxazole and steroids and start [place].  I will order urine and blood work to complete a baseline work-up for HIV infection.  This does not fit with simple bacterial community-acquired pneumonia so I will stop ceftriaxone and azithromycin.  I have discussed all of this with Amahya this afternoon and let her know that we will treat and support her here in the hospital and in our clinic after she is discharged.         Michel Bickers, MD [facility] [place] [facility] [phone] pager   [phone] cell 09/09/2019, 3:27 PM

## 2019-09-09 NOTE — Care Management (Signed)
Consult for home O2  Will need ambulatory oxygen sats noted within 48 hours of discharge. Patient w/o prior COPD or chronic lung disease Will need to use [employer] or [employer] if home O2 needed.  TOC will continue to follow.

## 2019-09-09 NOTE — Progress Notes (Signed)
Patient ID: Deborah Durham, female   DOB: 03/02/1971, 49 y.o.   MRN: [id number]         [facility]  Date of Admission:  09/06/2019           Day 4 ceftriaxone        Day 4 azithromycin        Day 2 trimethoprim sulfamethoxazole and steroids ASSESSMENT: Her HIV confirmatory testing is still pending.  Absolute CD4 count is less than 35 which makes me much more concerned that she is HIV infected and has pneumocystis pneumonia.  I talked to her about my concerns.  I recommend continuing current antibiotics and steroids.  PLAN: 1. Continue current antibiotics and steroid 2. Await confirmatory HIV antibody testing 3. Order HIV viral load  Principal Problem:   HIV test positive ([place]) Active Problems:   CAP (community acquired pneumonia)   Hypoxia   Normocytic anemia   Status post cholecystectomy   Scheduled Meds: . enoxaparin (LOVENOX) injection  40 mg Subcutaneous Q24H  . guaiFENesin  600 mg Oral BID  . methylPREDNISolone (SOLU-MEDROL) injection  40 mg Intravenous Q12H  . sodium chloride flush  10-40 mL Intracatheter Q12H   Continuous Infusions: . cefTRIAXone (ROCEPHIN)  IV 1 g (09/08/19 2212)  . dextrose 5 % and 0.45% NaCl 100 mL/hr at 09/09/19 1131  . sulfamethoxazole-trimethoprim 400 mg of trimethoprim (09/09/19 1138)   PRN Meds:.acetaminophen **OR** acetaminophen, levalbuterol, sodium chloride flush   SUBJECTIVE: She was feeling better this morning and her supplemental oxygen had been weaned down to 2 L but she became very dyspneic and had oxygen desaturation when she started to eat lunch.  Her nurse turned her supplemental oxygen up.  She tells me that her negative HIV antibody test was done last October in [place], [place] by her PCP.  Review of Systems: Review of Systems  Constitutional: Positive for weight loss. Negative for chills and fever.  Respiratory: Positive for cough and shortness of breath. Negative for sputum production.     Cardiovascular: Negative for chest pain.    No Known Allergies  OBJECTIVE: Vitals:   09/08/19 2216 09/08/19 2300 09/09/19 0800 09/09/19 1200  BP:  110/79 125/81 136/87  Pulse: 88 65 65 74  Resp: 19 20 17 20   Temp:  (!) 97.1 F (36.2 C) 97.8 F (36.6 C)   TempSrc:  Oral Oral   SpO2: 100% 100% 94% 99%  Weight:      Height:       Body mass index is 24.14 kg/m.  Physical Exam Constitutional:      Comments: She is sitting up in bed with her lunch tray in front of her.  She is slightly dyspneic and anxious.  Cardiovascular:     Rate and Rhythm: Regular rhythm. Tachycardia present.     Heart sounds: No murmur.  Pulmonary:     Breath sounds: Normal breath sounds.     Comments: She has some increased work of breathing.  Her lungs are clear.    Lab Results Lab Results  Component Value Date   WBC 9.5 09/09/2019   HGB 9.9 (L) 09/09/2019   HCT 32.0 (L) 09/09/2019   MCV 85.8 09/09/2019   PLT 358 09/09/2019    Lab Results  Component Value Date   CREATININE 0.52 09/09/2019   BUN 5 (L) 09/09/2019   NA 138 09/09/2019   K 3.7 09/09/2019   CL 105 09/09/2019   CO2 24 09/09/2019    Lab Results  Component Value Date   ALT 14 09/09/2019   AST 34 09/09/2019   ALKPHOS 48 09/09/2019   BILITOT 0.1 (L) 09/09/2019     Microbiology: Recent Results (from the past 240 hour(s))  Novel Coronavirus, NAA ([facility])     Status: None   Collection Time: 09/04/19  9:15 AM   Specimen: Nasopharyngeal(NP) swabs in vial transport medium   NASOPHARYNGE  TESTING  Result Value Ref Range Status   SARS-CoV-2, NAA Not Detected Not Detected Final    Comment: This nucleic acid amplification test was developed and its performance characteristics determined by [employer]. Nucleic acid amplification tests include PCR and TMA. This test has not been FDA cleared or approved. This test has been authorized by FDA under an Emergency Use Authorization (EUA). This test is only authorized for the  duration of time the declaration that circumstances exist justifying the authorization of the emergency use of in vitro diagnostic tests for detection of SARS-CoV-2 virus and/or diagnosis of COVID-19 infection under section 564(b)(1) of the Act, 21 U.S.C. [id number]) (1), unless the authorization is terminated or revoked sooner. When diagnostic testing is negative, the possibility of a false negative result should be considered in the context of a patient's recent exposures and the presence of clinical signs and symptoms consistent with COVID-19. An individual without symptoms of COVID-19 and who is not shedding SARS-CoV-2 virus would  expect to have a negative (not detected) result in this assay.   SARS CORONAVIRUS 2 (TAT 6-24 HRS) Nasopharyngeal Nasopharyngeal Swab     Status: None   Collection Time: 09/07/19 12:07 AM   Specimen: Nasopharyngeal Swab  Result Value Ref Range Status   SARS Coronavirus 2 NEGATIVE NEGATIVE Final    Comment: (NOTE) SARS-CoV-2 target nucleic acids are NOT DETECTED. The SARS-CoV-2 RNA is generally detectable in upper and lower respiratory specimens during the acute phase of infection. Negative results do not preclude SARS-CoV-2 infection, do not rule out co-infections with other pathogens, and should not be used as the sole basis for treatment or other patient management decisions. Negative results must be combined with clinical observations, patient history, and epidemiological information. The expected result is Negative. Fact Sheet for Patients: SugarRoll.be Fact Sheet for Healthcare Providers: https://www.woods-mathews.com/ This test is not yet approved or cleared by the [place] FDA and  has been authorized for detection and/or diagnosis of SARS-CoV-2 by FDA under an Emergency Use Authorization (EUA). This EUA will remain  in effect (meaning this test can be used) for the duration of the COVID-19  declaration under Section 56 4(b)(1) of the Act, 21 U.S.C. section 360bbb-3(b)(1), unless the authorization is terminated or revoked sooner. Performed at [facility], [place] [address] [place], [place] [address]   SARS CORONAVIRUS 2 (TAT 6-24 HRS) Nasopharyngeal Nasopharyngeal Swab     Status: None   Collection Time: 09/07/19  3:47 PM   Specimen: Nasopharyngeal Swab  Result Value Ref Range Status   SARS Coronavirus 2 NEGATIVE NEGATIVE Final    Comment: (NOTE) SARS-CoV-2 target nucleic acids are NOT DETECTED. The SARS-CoV-2 RNA is generally detectable in upper and lower respiratory specimens during the acute phase of infection. Negative results do not preclude SARS-CoV-2 infection, do not rule out co-infections with other pathogens, and should not be used as the sole basis for treatment or other patient management decisions. Negative results must be combined with clinical observations, patient history, and epidemiological information. The expected result is Negative. Fact Sheet for Patients: SugarRoll.be Fact Sheet for Healthcare Providers:  https://www.woods-mathews.com/ This test is not yet approved or cleared by the [place] and  has been authorized for detection and/or diagnosis of SARS-CoV-2 by FDA under an Emergency Use Authorization (EUA). This EUA will remain  in effect (meaning this test can be used) for the duration of the COVID-19 declaration under Section 56 4(b)(1) of the Act, 21 U.S.C. section 360bbb-3(b)(1), unless the authorization is terminated or revoked sooner. Performed at [facility], [place] [address], [place], [place] [address]   Respiratory Panel by PCR     Status: None   Collection Time: 09/08/19 11:43 AM   Specimen: Nasopharyngeal Swab; Respiratory  Result Value Ref Range Status   Adenovirus NOT DETECTED NOT DETECTED Final   Coronavirus 229E NOT DETECTED NOT DETECTED Final    Comment:  (NOTE) The Coronavirus on the Respiratory Panel, DOES NOT test for the novel  Coronavirus (2019 nCoV)    Coronavirus HKU1 NOT DETECTED NOT DETECTED Final   Coronavirus NL63 NOT DETECTED NOT DETECTED Final   Coronavirus OC43 NOT DETECTED NOT DETECTED Final   Metapneumovirus NOT DETECTED NOT DETECTED Final   Rhinovirus / Enterovirus NOT DETECTED NOT DETECTED Final   Influenza A NOT DETECTED NOT DETECTED Final   Influenza B NOT DETECTED NOT DETECTED Final   Parainfluenza Virus 1 NOT DETECTED NOT DETECTED Final   Parainfluenza Virus 2 NOT DETECTED NOT DETECTED Final   Parainfluenza Virus 3 NOT DETECTED NOT DETECTED Final   Parainfluenza Virus 4 NOT DETECTED NOT DETECTED Final   Respiratory Syncytial Virus NOT DETECTED NOT DETECTED Final   Bordetella pertussis NOT DETECTED NOT DETECTED Final   Chlamydophila pneumoniae NOT DETECTED NOT DETECTED Final   Mycoplasma pneumoniae NOT DETECTED NOT DETECTED Final    Comment: Performed at [facility], [place]. [address] [place], [place] [address]    Michel Bickers, [facility] [phone] pager   [phone] cell 09/09/2019, 2:16 PM

## 2019-09-10 DIAGNOSIS — J9601 Acute respiratory failure with hypoxia: Secondary | ICD-10-CM | POA: Diagnosis present

## 2019-09-10 DIAGNOSIS — B20 Human immunodeficiency virus [HIV] disease: Principal | ICD-10-CM

## 2019-09-10 DIAGNOSIS — J189 Pneumonia, unspecified organism: Secondary | ICD-10-CM | POA: Insufficient documentation

## 2019-09-10 DIAGNOSIS — R112 Nausea with vomiting, unspecified: Secondary | ICD-10-CM | POA: Diagnosis present

## 2019-09-10 DIAGNOSIS — R7401 Elevation of levels of liver transaminase levels: Secondary | ICD-10-CM

## 2019-09-10 LAB — SEDIMENTATION RATE: Sed Rate: 108 mm/hr — ABNORMAL HIGH (ref 0–22)

## 2019-09-10 LAB — COMPREHENSIVE METABOLIC PANEL
ALT: 48 U/L — ABNORMAL HIGH (ref 0–44)
AST: 83 U/L — ABNORMAL HIGH (ref 15–41)
Albumin: 2.1 g/dL — ABNORMAL LOW (ref 3.5–5.0)
Alkaline Phosphatase: 51 U/L (ref 38–126)
Anion gap: 9 (ref 5–15)
BUN: <5 mg/dL — ABNORMAL LOW (ref 6–20)
CO2: 23 mmol/L (ref 22–32)
Calcium: 8.4 mg/dL — ABNORMAL LOW (ref 8.9–10.3)
Chloride: 110 mmol/L (ref 98–111)
Creatinine, Ser: 0.6 mg/dL (ref 0.44–1.00)
GFR calc Af Amer: >60 mL/min (ref >60)
GFR calc non Af Amer: >60 mL/min (ref >60)
Glucose, Bld: 116 mg/dL — ABNORMAL HIGH (ref 70–99)
Potassium: 3.7 mmol/L (ref 3.5–5.1)
Sodium: 142 mmol/L (ref 135–145)
Total Bilirubin: 0.2 mg/dL — ABNORMAL LOW (ref 0.3–1.2)
Total Protein: 6.7 g/dL (ref 6.5–8.1)

## 2019-09-10 LAB — CBC
HCT: 30.8 % — ABNORMAL LOW (ref 36.0–46.0)
Hemoglobin: 9.7 g/dL — ABNORMAL LOW (ref 12.0–15.0)
MCH: 27.1 pg (ref 26.0–34.0)
MCHC: 31.5 g/dL (ref 30.0–36.0)
MCV: 86 fL (ref 80.0–100.0)
Platelets: 401 10*3/uL — ABNORMAL HIGH (ref 150–400)
RBC: 3.58 MIL/uL — ABNORMAL LOW (ref 3.87–5.11)
RDW: 15.5 % (ref 11.5–15.5)
WBC: 19.5 10*3/uL — ABNORMAL HIGH (ref 4.0–10.5)
nRBC: 0 % (ref 0.0–0.2)

## 2019-09-10 LAB — MAGNESIUM: Magnesium: 2.1 mg/dL (ref 1.7–2.4)

## 2019-09-10 LAB — C-REACTIVE PROTEIN: CRP: 11.2 mg/dL — ABNORMAL HIGH (ref <1.0)

## 2019-09-10 LAB — RPR: RPR Ser Ql: NONREACTIVE

## 2019-09-10 LAB — HEPATITIS B SURFACE ANTIBODY, QUANTITATIVE: Hep B S AB Quant (Post): <3.1 m[IU]/mL — ABNORMAL LOW (ref >9.9)

## 2019-09-10 MED ORDER — PROCHLORPERAZINE EDISYLATE 10 MG/2ML IJ SOLN
10.0000 mg | Freq: Four times a day (QID) | INTRAMUSCULAR | Status: DC | PRN
  Administered 2019-09-11: 10 mg via INTRAVENOUS
  Filled 2019-09-10 (×2): qty 2

## 2019-09-10 MED ORDER — ONDANSETRON HCL 4 MG/2ML IJ SOLN
4.0000 mg | Freq: Three times a day (TID) | INTRAMUSCULAR | Status: AC
  Administered 2019-09-10 (×2): 4 mg via INTRAVENOUS
  Filled 2019-09-10 (×2): qty 2

## 2019-09-10 NOTE — Progress Notes (Signed)
HIV positive C/w PJP pneumonia Treat with usual course of steroids and bactrim Call if any further questions or concerns  Erskine Emery MD PCCM

## 2019-09-10 NOTE — Progress Notes (Signed)
PROGRESS NOTE    Deborah Durham  [id number] DOB: [birth year] DOA: 09/06/2019 PCP: Patient, No Pcp Per   Brief Narrative:  This is a 49 year old female with a 2-week history of shortness of breath, subjective fevers and decreased appetite with DOE with multiple negative Covid tests prior to admission and on admission.  Negative respiratory panel.  Found to have positive HIV antigen and multifocal pneumonia on CT scan.  Started on empiric treatment for pneumocystis pneumonia by pulmonology with Solu-Medrol 40 mg IV twice daily as well as Bactrim and continued on empiric CAP therapy with Rocephin and azithromycin.  ID consulted for HIV eval.    1/11: Per ID, HIV 1 antibody positive and low CD4 count confirms advanced HIV which has been complicated by suspected pneumocystis pneumonia.  Bactrim and steroids continued, started on Biktarvy and urine and blood work ordered for complete baseline work-up for HIV.  CAP Therapy discontinued.   Assessment & Plan:   Principal Problem:   Acute hypoxemic respiratory failure (HCC) Active Problems:   Pneumocystis jiroveci pneumonia (HCC)   Hypoxia   Normocytic anemia   HIV disease (HCC)   Status post cholecystectomy   Nausea & vomiting   Transaminitis   1 acute hypoxemic respiratory failure secondary to probable pneumocystis pneumonia in the setting of newly diagnosed HIV Patient presenting with hypoxemic respiratory failure with increased O2 requirements.  Patient with multiple COVID-19 test which were done which were negative.  Respiratory viral panel negative.  Patient with poor air movement and on 7 L high flow nasal cannula.  LDH elevated.  Rheumatological work-up ordered per pulmonary prior to positive result HIV confirmatory test pending.  Patient was on IV Rocephin and azithromycin for probable CAP which has been discontinued by ID.  Continue IV Solu-Medrol and IV Bactrim.  Supportive care.  ID and pulmonary following and appreciate input  and recommendations.  2.  Newly diagnosed HIV Screening HIV antigen positive.  HIV 1 antigen positive.  Patient with a low CD4 count.  Patient currently on IV steroids and IV Bactrim and started on Biktarvy per ID.  ID following and appreciate input and recommendations.  3.  Trace pericardial effusion Outpatient follow-up.  4.  Transaminitis Questionable etiology.  Hepatitis C antibody nonreactive.  Follow.  5.  Nausea and vomiting Patient noted to have some nausea vomiting overnight which has since improved and resolved. ??  Secondary to oral Bactrim.  Oral Bactrim has been changed to IV Bactrim.  Patient states having bowel movement and passing gas.  Supportive care.  6.   DVT prophylaxis: Lovenox Code Status: Full Family Communication: Updated patient.  No family at bedside. Disposition Plan: Likely home when clinically improved, O2 requirements have improved and when cleared by ID.   Consultants:   Infectious disease: Dr. Megan Salon 09/08/2019  PCCM: Dr. Shearon Stalls 09/08/2019  Procedures:   2D echo 09/08/2019  Chest x-ray 09/06/2019, 09/08/2019  CT angiogram chest 09/06/2019  Antimicrobials:   Biktarvy 09/09/2019  Oral Bactrim 09/08/2019>>> 09/09/2019  IV azithromycin 09/06/2019>>>>> 09/09/2019  IV Rocephin 09/06/2019>> 09/09/2019  IV Bactrim 09/09/2019   Subjective: Patient states had some nausea and vomiting all night.  Had bowel movement yesterday.  Denies any further nausea or vomiting.  Denies any chest pain.  Still short of breath however states slowly improving since admission.  Passing gas.  Patient on 7 L high flow O2 with sats of 94 to 95%.  Objective: Vitals:   09/10/19 0000 09/10/19 0419 09/10/19 0731 09/10/19 1128  BP: 136/81  125/75 123/85 131/84  Pulse: 68 66 82 86  Resp: 18  (!) 30 (!) 32  Temp: 97.8 F (36.6 C) 98 F (36.7 C) 98.1 F (36.7 C) 98.3 F (36.8 C)  TempSrc: Oral Oral Oral Oral  SpO2: 100% 98% 100% 94%  Weight:      Height:         Intake/Output Summary (Last 24 hours) at 09/10/2019 1625 Last data filed at 09/10/2019 0300 Gross per 24 hour  Intake 469.73 ml  Output 500 ml  Net -30.27 ml   Filed Weights   09/06/19 1552  Weight: 59.9 kg    Examination:  General exam: Appears calm and comfortable  Respiratory system: Some coarse breath sounds in the right base.  Poor air movement.  Cardiovascular system: S1 & S2 heard, RRR. No JVD, murmurs, rubs, gallops or clicks. No pedal edema. Gastrointestinal system: Abdomen is nondistended, soft and nontender. No organomegaly or masses felt. Normal bowel sounds heard. Central nervous system: Alert and oriented. No focal neurological deficits. Extremities: Symmetric 5 x 5 power. Skin: No rashes, lesions or ulcers Psychiatry: Judgement and insight appear normal. Mood & affect appropriate.     Data Reviewed: I have personally reviewed following labs and imaging studies  CBC: Recent Labs  Lab 09/06/19 1605 09/06/19 2049 09/07/19 0410 09/08/19 1230 09/09/19 0256 09/10/19 0610  WBC 8.8  --  9.4 18.0* 9.5 19.5*  NEUTROABS  --   --   --  14.7*  --   --   HGB 11.7* 11.2* 10.7* 10.1* 9.9* 9.7*  HCT 37.3 33.0* 34.7* 31.6* 32.0* 30.8*  MCV 85.7  --  86.5 84.7 85.8 86.0  PLT 429*  --  400 356 358 [id number]*   Basic Metabolic Panel: Recent Labs  Lab 09/06/19 1605 09/06/19 2049 09/07/19 0410 09/08/19 1230 09/09/19 0256 09/10/19 0610  NA 135 137  --  136 138 142  K 3.8 4.0  --  3.5 3.7 3.7  CL 101  --   --  105 105 110  CO2 22  --   --  22 24 23   GLUCOSE 95  --   --  159* 164* 116*  BUN 8  --   --  <5* 5* <5*  CREATININE 0.61  --  0.60 0.62 0.52 0.60  CALCIUM 9.0  --   --  8.3* 8.3* 8.4*  MG  --   --   --   --   --  2.1   GFR: Estimated Creatinine Clearance: 68 mL/min (by C-G formula based on SCr of 0.6 mg/dL). Liver Function Tests: Recent Labs  Lab 09/07/19 0410 09/08/19 1230 09/09/19 0256 09/10/19 0610  AST 46* 51* 34 83*  ALT 16 13 14  48*  ALKPHOS 50  50 48 51  BILITOT 0.4 0.5 0.1* 0.2*  PROT 7.3 6.8 6.5 6.7  ALBUMIN 2.8* 2.3* 2.1* 2.1*   No results for input(s): LIPASE, AMYLASE in the last 168 hours. No results for input(s): AMMONIA in the last 168 hours. Coagulation Profile: No results for input(s): INR, PROTIME in the last 168 hours. Cardiac Enzymes: No results for input(s): CKTOTAL, CKMB, CKMBINDEX, TROPONINI in the last 168 hours. BNP (last 3 results) No results for input(s): PROBNP in the last 8760 hours. HbA1C: No results for input(s): HGBA1C in the last 72 hours. CBG: No results for input(s): GLUCAP in the last 168 hours. Lipid Profile: No results for input(s): CHOL, HDL, LDLCALC, TRIG, CHOLHDL, LDLDIRECT in the last 72 hours. Thyroid Function Tests:  No results for input(s): TSH, T4TOTAL, FREET4, T3FREE, THYROIDAB in the last 72 hours. Anemia Panel: No results for input(s): VITAMINB12, FOLATE, FERRITIN, TIBC, IRON, RETICCTPCT in the last 72 hours. Sepsis Labs: Recent Labs  Lab 09/07/19 0410 09/08/19 1230  PROCALCITON 0.12 0.44    Recent Results (from the past 240 hour(s))  Novel Coronavirus, NAA ([facility])     Status: None   Collection Time: 09/04/19  9:15 AM   Specimen: Nasopharyngeal(NP) swabs in vial transport medium   NASOPHARYNGE  TESTING  Result Value Ref Range Status   SARS-CoV-2, NAA Not Detected Not Detected Final    Comment: This nucleic acid amplification test was developed and its performance characteristics determined by [employer], [employer] and [employer]. Nucleic acid amplification tests include PCR and TMA. This test has not been FDA cleared or approved. This test has been authorized by FDA under an Emergency Use Authorization (EUA). This test is only authorized for the duration of time the declaration that circumstances exist justifying the authorization of the emergency use of in vitro diagnostic tests for detection of SARS-CoV-2 virus and/or diagnosis of COVID-19 infection under section 564(b)(1) of the  Act, 21 U.S.C. [id number]) (1), unless the authorization is terminated or revoked sooner. When diagnostic testing is negative, the possibility of a false negative result should be considered in the context of a patient's recent exposures and the presence of clinical signs and symptoms consistent with COVID-19. An individual without symptoms of COVID-19 and who is not shedding SARS-CoV-2 virus would  expect to have a negative (not detected) result in this assay.   SARS CORONAVIRUS 2 (TAT 6-24 HRS) Nasopharyngeal Nasopharyngeal Swab     Status: None   Collection Time: 09/07/19 12:07 AM   Specimen: Nasopharyngeal Swab  Result Value Ref Range Status   SARS Coronavirus 2 NEGATIVE NEGATIVE Final    Comment: (NOTE) SARS-CoV-2 target nucleic acids are NOT DETECTED. The SARS-CoV-2 RNA is generally detectable in upper and lower respiratory specimens during the acute phase of infection. Negative results do not preclude SARS-CoV-2 infection, do not rule out co-infections with other pathogens, and should not be used as the sole basis for treatment or other patient management decisions. Negative results must be combined with clinical observations, patient history, and epidemiological information. The expected result is Negative. Fact Sheet for Patients: SugarRoll.be Fact Sheet for Healthcare Providers: https://www.woods-mathews.com/ This test is not yet approved or cleared by the [place] FDA and  has been authorized for detection and/or diagnosis of SARS-CoV-2 by FDA under an Emergency Use Authorization (EUA). This EUA will remain  in effect (meaning this test can be used) for the duration of the COVID-19 declaration under Section 56 4(b)(1) of the Act, 21 U.S.C. section 360bbb-3(b)(1), unless the authorization is terminated or revoked sooner. Performed at [facility], [place] [address], [place], [place] [address]   SARS CORONAVIRUS 2 (TAT  6-24 HRS) Nasopharyngeal Nasopharyngeal Swab     Status: None   Collection Time: 09/07/19  3:47 PM   Specimen: Nasopharyngeal Swab  Result Value Ref Range Status   SARS Coronavirus 2 NEGATIVE NEGATIVE Final    Comment: (NOTE) SARS-CoV-2 target nucleic acids are NOT DETECTED. The SARS-CoV-2 RNA is generally detectable in upper and lower respiratory specimens during the acute phase of infection. Negative results do not preclude SARS-CoV-2 infection, do not rule out co-infections with other pathogens, and should not be used as the sole basis for treatment or other patient management decisions. Negative results must be combined with clinical observations, patient  history, and epidemiological information. The expected result is Negative. Fact Sheet for Patients: SugarRoll.be Fact Sheet for Healthcare Providers: https://www.woods-mathews.com/ This test is not yet approved or cleared by the [place] FDA and  has been authorized for detection and/or diagnosis of SARS-CoV-2 by FDA under an Emergency Use Authorization (EUA). This EUA will remain  in effect (meaning this test can be used) for the duration of the COVID-19 declaration under Section 56 4(b)(1) of the Act, 21 U.S.C. section 360bbb-3(b)(1), unless the authorization is terminated or revoked sooner. Performed at [facility], [place] [address], [place], [place] [address]   Respiratory Panel by PCR     Status: None   Collection Time: 09/08/19 11:43 AM   Specimen: Nasopharyngeal Swab; Respiratory  Result Value Ref Range Status   Adenovirus NOT DETECTED NOT DETECTED Final   Coronavirus 229E NOT DETECTED NOT DETECTED Final    Comment: (NOTE) The Coronavirus on the Respiratory Panel, DOES NOT test for the novel  Coronavirus (2019 nCoV)    Coronavirus HKU1 NOT DETECTED NOT DETECTED Final   Coronavirus NL63 NOT DETECTED NOT DETECTED Final   Coronavirus OC43 NOT DETECTED NOT DETECTED  Final   Metapneumovirus NOT DETECTED NOT DETECTED Final   Rhinovirus / Enterovirus NOT DETECTED NOT DETECTED Final   Influenza A NOT DETECTED NOT DETECTED Final   Influenza B NOT DETECTED NOT DETECTED Final   Parainfluenza Virus 1 NOT DETECTED NOT DETECTED Final   Parainfluenza Virus 2 NOT DETECTED NOT DETECTED Final   Parainfluenza Virus 3 NOT DETECTED NOT DETECTED Final   Parainfluenza Virus 4 NOT DETECTED NOT DETECTED Final   Respiratory Syncytial Virus NOT DETECTED NOT DETECTED Final   Bordetella pertussis NOT DETECTED NOT DETECTED Final   Chlamydophila pneumoniae NOT DETECTED NOT DETECTED Final   Mycoplasma pneumoniae NOT DETECTED NOT DETECTED Final    Comment: Performed at [facility], [place]. [address], [place], [place] [address]         Radiology Studies: No results found.      Scheduled Meds: . bictegravir-emtricitabine-tenofovir AF  1 tablet Oral Daily  . enoxaparin (LOVENOX) injection  40 mg Subcutaneous Q24H  . guaiFENesin  600 mg Oral BID  . methylPREDNISolone (SOLU-MEDROL) injection  40 mg Intravenous Q12H  . sodium chloride flush  10-40 mL Intracatheter Q12H   Continuous Infusions: . dextrose 5 % and 0.45% NaCl 100 mL/hr at 09/09/19 1131  . sulfamethoxazole-trimethoprim 400 mg of trimethoprim (09/10/19 1401)     LOS: 4 days    Time spent: 40 minutes    Irine Seal, MD [facility]  If 7PM-7AM, please contact night-coverage [email or web address] 09/10/2019, 4:25 PM

## 2019-09-10 NOTE — Plan of Care (Signed)
  Problem: Education: Goal: Knowledge of General Education information will improve Description Including pain rating scale, medication(s)/side effects and non-pharmacologic comfort measures Outcome: Progressing   

## 2019-09-11 ENCOUNTER — Telehealth: Payer: Self-pay | Admitting: Pharmacy Technician

## 2019-09-11 DIAGNOSIS — R441 Visual hallucinations: Secondary | ICD-10-CM | POA: Diagnosis not present

## 2019-09-11 DIAGNOSIS — R110 Nausea: Secondary | ICD-10-CM

## 2019-09-11 DIAGNOSIS — R1013 Epigastric pain: Secondary | ICD-10-CM

## 2019-09-11 LAB — COMPREHENSIVE METABOLIC PANEL
ALT: 85 U/L — ABNORMAL HIGH (ref 0–44)
AST: 78 U/L — ABNORMAL HIGH (ref 15–41)
Albumin: 2.1 g/dL — ABNORMAL LOW (ref 3.5–5.0)
Alkaline Phosphatase: 56 U/L (ref 38–126)
Anion gap: 9 (ref 5–15)
BUN: 8 mg/dL (ref 6–20)
CO2: 22 mmol/L (ref 22–32)
Calcium: 8.1 mg/dL — ABNORMAL LOW (ref 8.9–10.3)
Chloride: 106 mmol/L (ref 98–111)
Creatinine, Ser: 0.57 mg/dL (ref 0.44–1.00)
GFR calc Af Amer: >60 mL/min (ref >60)
GFR calc non Af Amer: >60 mL/min (ref >60)
Glucose, Bld: 110 mg/dL — ABNORMAL HIGH (ref 70–99)
Potassium: 3.9 mmol/L (ref 3.5–5.1)
Sodium: 137 mmol/L (ref 135–145)
Total Bilirubin: 0.4 mg/dL (ref 0.3–1.2)
Total Protein: 6.4 g/dL — ABNORMAL LOW (ref 6.5–8.1)

## 2019-09-11 LAB — RHEUMATOID FACTOR: Rheumatoid fact SerPl-aCnc: 16 IU/mL — ABNORMAL HIGH (ref 0.0–13.9)

## 2019-09-11 LAB — CBC WITH DIFFERENTIAL/PLATELET
Abs Immature Granulocytes: 0.16 10*3/uL — ABNORMAL HIGH (ref 0.00–0.07)
Basophils Absolute: 0 10*3/uL (ref 0.0–0.1)
Basophils Relative: 0 %
Eosinophils Absolute: 0 10*3/uL (ref 0.0–0.5)
Eosinophils Relative: 0 %
HCT: 29.5 % — ABNORMAL LOW (ref 36.0–46.0)
Hemoglobin: 9.3 g/dL — ABNORMAL LOW (ref 12.0–15.0)
Immature Granulocytes: 1 %
Lymphocytes Relative: 5 %
Lymphs Abs: 0.8 10*3/uL (ref 0.7–4.0)
MCH: 27 pg (ref 26.0–34.0)
MCHC: 31.5 g/dL (ref 30.0–36.0)
MCV: 85.5 fL (ref 80.0–100.0)
Monocytes Absolute: 0.5 10*3/uL (ref 0.1–1.0)
Monocytes Relative: 3 %
Neutro Abs: 15.4 10*3/uL — ABNORMAL HIGH (ref 1.7–7.7)
Neutrophils Relative %: 91 %
Platelets: 401 10*3/uL — ABNORMAL HIGH (ref 150–400)
RBC: 3.45 MIL/uL — ABNORMAL LOW (ref 3.87–5.11)
RDW: 15.4 % (ref 11.5–15.5)
WBC: 16.9 10*3/uL — ABNORMAL HIGH (ref 4.0–10.5)
nRBC: 0 % (ref 0.0–0.2)

## 2019-09-11 LAB — ANCA TITERS
Atypical P-ANCA titer: <1:20 {titer}
C-ANCA: <1:20 {titer}
P-ANCA: <1:20 {titer}

## 2019-09-11 LAB — LEGIONELLA PNEUMOPHILA SEROGP 1 UR AG: L. pneumophila Serogp 1 Ur Ag: NEGATIVE

## 2019-09-11 LAB — QUANTIFERON-TB GOLD PLUS (RQFGPL)
QuantiFERON Mitogen Value: 0.06 IU/mL
QuantiFERON Nil Value: 0.03 IU/mL
QuantiFERON TB1 Ag Value: 0.04 IU/mL
QuantiFERON TB2 Ag Value: 0.04 IU/mL

## 2019-09-11 LAB — QUANTIFERON-TB GOLD PLUS: QuantiFERON-TB Gold Plus: UNDETERMINED — AB

## 2019-09-11 LAB — MAGNESIUM: Magnesium: 2.1 mg/dL (ref 1.7–2.4)

## 2019-09-11 LAB — MPO/PR-3 (ANCA) ANTIBODIES
ANCA Proteinase 3: <3.5 U/mL (ref 0.0–3.5)
Myeloperoxidase Abs: <9 U/mL (ref 0.0–9.0)

## 2019-09-11 LAB — SCLERODERMA DIAGNOSTIC PROFILE
Anti Nuclear Antibody (ANA): NEGATIVE
Scleroderma (Scl-70) (ENA) Antibody, IgG: <0.2 AI (ref 0.0–0.9)

## 2019-09-11 LAB — ANA W/REFLEX IF POSITIVE: Anti Nuclear Antibody (ANA): NEGATIVE

## 2019-09-11 MED ORDER — METHYLPREDNISOLONE SODIUM SUCC 40 MG IJ SOLR
40.0000 mg | Freq: Every day | INTRAMUSCULAR | Status: DC
  Administered 2019-09-12 – 2019-09-15 (×4): 40 mg via INTRAVENOUS
  Filled 2019-09-11 (×4): qty 1

## 2019-09-11 MED ORDER — PANTOPRAZOLE SODIUM 40 MG PO TBEC
40.0000 mg | DELAYED_RELEASE_TABLET | Freq: Every day | ORAL | Status: DC
  Administered 2019-09-11 – 2019-09-14 (×4): 40 mg via ORAL
  Filled 2019-09-11 (×4): qty 1

## 2019-09-11 NOTE — Progress Notes (Signed)
Patient ID: Deborah Durham, female   DOB: 30-Sep-1970, 49 y.o.   MRN: [id number]         [facility] for Infectious Disease  Date of Admission:  09/06/2019           Day 3 trimethoprim sulfamethoxazole and steroids ASSESSMENT: I doubt that Trimethoprim/Sulfamethoxazole is causing any of her symptoms overnight.  I encouraged her to let us restarted and she agrees.  I also spoke with her nurse.  I think that her nausea and epigastric tenderness may be related to acid reflux and will start a PPI.  I think her visual hallucinations may be due to her illness and side effects of her steroids.  We will reduce the dose of her steroids now.  PLAN: 1. Continue Biktarvy 2. Continue trimethoprim sulfamethoxazole and a reduced dose of steroids 3. 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 bictegravir-emtricitabine-tenofovir AF  1 tablet Oral Daily  . enoxaparin (LOVENOX) injection  40 mg Subcutaneous Q24H  . guaiFENesin  600 mg Oral BID  . [START ON 09/12/2019] methylPREDNISolone (SOLU-MEDROL) injection  40 mg Intravenous Daily  . pantoprazole  40 mg Oral Daily  . sodium chloride flush  10-40 mL Intracatheter Q12H   Continuous Infusions: . dextrose 5 % and 0.45% NaCl 100 mL/hr at 09/09/19 1131  . sulfamethoxazole-trimethoprim 400 mg of trimethoprim (09/10/19 1401)   PRN Meds:.acetaminophen **OR** acetaminophen, levalbuterol, prochlorperazine, sodium chloride flush   SUBJECTIVE: Deborah Durham tells me that she had another rough night.  She has had nausea for the past 2 evenings.  She does not have nausea during the day.  Last night she also had epigastric pain.  She felt that it was most likely due to her IV Trimethoprim/Sulfamethoxazole and refused  2 doses last night until she could talk with me.  She also had some visual hallucinations.  She says that she saw a man standing at the foot of her bed. She also saw snakes and spiders.  They went away when her nurse turned on the lights.  She feels that she is breathing a little better today.  Review of Systems: Review of Systems  Constitutional: Positive for malaise/fatigue and weight loss. Negative for chills, diaphoresis and fever.  HENT: Negative for congestion and sore throat.   Respiratory: Positive for cough and shortness of breath. Negative for hemoptysis, sputum production and wheezing.   Cardiovascular: Negative for chest pain.  Gastrointestinal: Positive for heartburn and nausea. Negative for abdominal pain, diarrhea and vomiting.  Skin: Negative for rash.  Psychiatric/Behavioral: Positive for hallucinations.    No Known Allergies  OBJECTIVE: Vitals:   09/11/19 0100 09/11/19 0700 09/11/19 0703 09/11/19 1100  BP: 134/84 127/85  127/84  Pulse: (!) 57 72  70  Resp: (!) 30 16 16  (!) 26  Temp: 98.3 F (36.8 C) 98.5 F (36.9 C)  98 F (36.7 C)  TempSrc: Oral Oral  Oral  SpO2: 100% 97%  94%  Weight:      Height:       Body mass index is 24.14 kg/m.  Physical Exam Constitutional:      Comments: She is resting quietly in bed.  HENT:     Mouth/Throat:     Pharynx: No oropharyngeal exudate.  Cardiovascular:  Rate and Rhythm: Normal rate and regular rhythm.     Heart sounds: No murmur.  Pulmonary:     Effort: Pulmonary effort is normal.     Breath sounds: Normal breath sounds. No wheezing, rhonchi or rales.     Comments: She is using supplemental nasal prong oxygen at 3 L/min and saturating well. Abdominal:     General: There is no distension.     Palpations: Abdomen is soft.     Tenderness: There is abdominal tenderness.     Comments: She has epigastric tenderness.  Psychiatric:        Mood and Affect: Mood normal.     Lab Results Lab Results  Component  Value Date   WBC 16.9 (H) 09/11/2019   HGB 9.3 (L) 09/11/2019   HCT 29.5 (L) 09/11/2019   MCV 85.5 09/11/2019   PLT 401 (H) 09/11/2019    Lab Results  Component Value Date   CREATININE 0.57 09/11/2019   BUN 8 09/11/2019   NA 137 09/11/2019   K 3.9 09/11/2019   CL 106 09/11/2019   CO2 22 09/11/2019    Lab Results  Component Value Date   ALT 85 (H) 09/11/2019   AST 78 (H) 09/11/2019   ALKPHOS 56 09/11/2019   BILITOT 0.4 09/11/2019     Microbiology: Recent Results (from the past 240 hour(s))  Novel Coronavirus, NAA ([facility])     Status: None   Collection Time: 09/04/19  9:15 AM   Specimen: Nasopharyngeal(NP) swabs in vial transport medium   NASOPHARYNGE  TESTING  Result Value Ref Range Status   SARS-CoV-2, NAA Not Detected Not Detected Final    Comment: This nucleic acid amplification test was developed and its performance characteristics determined by [employer]. Nucleic acid amplification tests include PCR and TMA. This test has not been FDA cleared or approved. This test has been authorized by FDA under an Emergency Use Authorization (EUA). This test is only authorized for the duration of time the declaration that circumstances exist justifying the authorization of the emergency use of in vitro diagnostic tests for detection of SARS-CoV-2 virus and/or diagnosis of COVID-19 infection under section 564(b)(1) of the Act, 21 U.S.C. [id number]) (1), unless the authorization is terminated or revoked sooner. When diagnostic testing is negative, the possibility of a false negative result should be considered in the context of a patient's recent exposures and the presence of clinical signs and symptoms consistent with COVID-19. An individual without symptoms of COVID-19 and who is not shedding SARS-CoV-2 virus would  expect to have a negative (not detected) result in this assay.   SARS CORONAVIRUS 2 (TAT 6-24 HRS) Nasopharyngeal Nasopharyngeal Swab     Status: None     Collection Time: 09/07/19 12:07 AM   Specimen: Nasopharyngeal Swab  Result Value Ref Range Status   SARS Coronavirus 2 NEGATIVE NEGATIVE Final    Comment: (NOTE) SARS-CoV-2 target nucleic acids are NOT DETECTED. The SARS-CoV-2 RNA is generally detectable in upper and lower respiratory specimens during the acute phase of infection. Negative results do not preclude SARS-CoV-2 infection, do not rule out co-infections with other pathogens, and should not be used as the sole basis for treatment or other patient management decisions. Negative results must be combined with clinical observations, patient history, and epidemiological information. The expected result is Negative. Fact Sheet for Patients: SugarRoll.be Fact Sheet for Healthcare Providers: https://www.woods-mathews.com/ This test is not yet approved or cleared by the [place] FDA and  has been authorized for detection  and/or diagnosis of SARS-CoV-2 by FDA under an Emergency Use Authorization (EUA). This EUA will remain  in effect (meaning this test can be used) for the duration of the COVID-19 declaration under Section 56 4(b)(1) of the Act, 21 U.S.C. section 360bbb-3(b)(1), unless the authorization is terminated or revoked sooner. Performed at [facility], [place] [address], [place], [place] [address]   SARS CORONAVIRUS 2 (TAT 6-24 HRS) Nasopharyngeal Nasopharyngeal Swab     Status: None   Collection Time: 09/07/19  3:47 PM   Specimen: Nasopharyngeal Swab  Result Value Ref Range Status   SARS Coronavirus 2 NEGATIVE NEGATIVE Final    Comment: (NOTE) SARS-CoV-2 target nucleic acids are NOT DETECTED. The SARS-CoV-2 RNA is generally detectable in upper and lower respiratory specimens during the acute phase of infection. Negative results do not preclude SARS-CoV-2 infection, do not rule out co-infections with other pathogens, and should not be used as the sole basis for  treatment or other patient management decisions. Negative results must be combined with clinical observations, patient history, and epidemiological information. The expected result is Negative. Fact Sheet for Patients: SugarRoll.be Fact Sheet for Healthcare Providers: https://www.woods-mathews.com/ This test is not yet approved or cleared by the [place] FDA and  has been authorized for detection and/or diagnosis of SARS-CoV-2 by FDA under an Emergency Use Authorization (EUA). This EUA will remain  in effect (meaning this test can be used) for the duration of the COVID-19 declaration under Section 56 4(b)(1) of the Act, 21 U.S.C. section 360bbb-3(b)(1), unless the authorization is terminated or revoked sooner. Performed at [facility], [place] [address] [place], [place] [address]   Respiratory Panel by PCR     Status: None   Collection Time: 09/08/19 11:43 AM   Specimen: Nasopharyngeal Swab; Respiratory  Result Value Ref Range Status   Adenovirus NOT DETECTED NOT DETECTED Final   Coronavirus 229E NOT DETECTED NOT DETECTED Final    Comment: (NOTE) The Coronavirus on the Respiratory Panel, DOES NOT test for the novel  Coronavirus (2019 nCoV)    Coronavirus HKU1 NOT DETECTED NOT DETECTED Final   Coronavirus NL63 NOT DETECTED NOT DETECTED Final   Coronavirus OC43 NOT DETECTED NOT DETECTED Final   Metapneumovirus NOT DETECTED NOT DETECTED Final   Rhinovirus / Enterovirus NOT DETECTED NOT DETECTED Final   Influenza A NOT DETECTED NOT DETECTED Final   Influenza B NOT DETECTED NOT DETECTED Final   Parainfluenza Virus 1 NOT DETECTED NOT DETECTED Final   Parainfluenza Virus 2 NOT DETECTED NOT DETECTED Final   Parainfluenza Virus 3 NOT DETECTED NOT DETECTED Final   Parainfluenza Virus 4 NOT DETECTED NOT DETECTED Final   Respiratory Syncytial Virus NOT DETECTED NOT DETECTED Final   Bordetella pertussis NOT DETECTED NOT DETECTED Final    Chlamydophila pneumoniae NOT DETECTED NOT DETECTED Final   Mycoplasma pneumoniae NOT DETECTED NOT DETECTED Final    Comment: Performed at [facility], [place]. [address], [place], [place] [address]    Michel Bickers, [place] [facility] [phone] pager   [phone] cell 09/11/2019, 12:55 PM

## 2019-09-11 NOTE — Progress Notes (Signed)
Patient refused both doses of IV antibiotics  that were scheduled this shift. States the medication makes her extremely nauseous and gives her hallucinations. This nurse informed the patient that she has been receiving the same medication for days with no documented adverse effects. Also offered patient PRN nausea medication prior to administration but patient declined.

## 2019-09-11 NOTE — Progress Notes (Addendum)
PROGRESS NOTE    Deborah Durham  [id number] DOB: 1970-10-15 DOA: 09/06/2019 PCP: Patient, No Pcp Per   Brief Narrative:  As per HPI,   Patient is a 49 year old female with no significant past medical history presented to the hospital with  2-week history of shortness of breath, subjective fevers and decreased appetite with dyspnea on exertion with multiple negative Covid tests prior to admission and on admission.  Negative respiratory panel.  Patient was Found to have positive HIV antigen and multifocal pneumonia on CT scan.  She was Started on empiric treatment for pneumocystis pneumonia by pulmonology with Solu-Medrol 40 mg IV twice daily as well as Bactrim and continued on empiric CAP therapy with Rocephin and azithromycin.  ID consulted for HIV eval.  On1/11: Per ID, HIV 1 antibody positive and low CD4 count confirms advanced HIV which has been complicated by suspected pneumocystis pneumonia.  Bactrim and steroids continued, started on Biktarvy and urine and blood work ordered for complete baseline work-up for HIV.  CAP therapy discontinued.   Assessment & Plan:   Principal Problem:   Acute hypoxemic respiratory failure (HCC) Active Problems:   Pneumocystis jiroveci pneumonia (HCC)   Hypoxia   Normocytic anemia   HIV disease (H[facility])   Status post cholecystectomy   Nausea & vomiting   Transaminitis   Community acquired pneumonia   Acute hypoxic respiratory failure secondary to probable pneumocystis pneumonia in the setting of newly diagnosed HIV Negative Covid test and respiratory viral panel.  On 4 L of nasal cannula at this time. LDH elevated.  Was on IV Rocephin and azithromycin for probable CAP which has been discontinued by ID.  Continue IV Solu-Medrol and IV Bactrim.  ID and pulmonary on board.  Leukocytosis at 16.9 but on steroids.  Procalcitonin at 0.4 on 09/08/2019.  Continue incentive spirometry flutter valve.   Newly diagnosed HIV Screening HIV antigen positive.   HIV 1 antigen positive.  Absolute CD4 count of less than 35 on 09/09/2019.Marland Kitchen  Patient currently on IV steroids and IV Bactrim and started on Biktarvy per ID.  ID on board.  Trace pericardial effusion Recommend outpatient follow-up.  Elevated LFT. Could be secondary to HIV.  Hepatitis C antibody nonreactive.  Will trend CMP.  Nausea and vomiting Patient still complains of nausea and vomiting.  Oral Bactrim was changed to IV.  Medication induced.  Would appreciate ID to look into her medication.  DVT prophylaxis: Lovenox subcu  Code Status: Full code  Family Communication: Spoke with the patient at bedside.  Disposition Plan: Likely home when okay with ID.  Still hypoxic and very symptomatic.   Consultants:   Infectious disease: Dr. Megan Salon 09/08/2019  PCCM: Dr. Shearon Stalls 09/08/2019  Procedures:   2D echo 09/08/2019  Chest x-ray 09/06/2019, 09/08/2019  CT angiogram chest 09/06/2019  Antimicrobials:   Biktarvy 09/09/2019  Oral Bactrim 09/08/2019>>> 09/09/2019  IV azithromycin 09/06/2019>>>>> 09/09/2019  IV Rocephin 09/06/2019>> 09/09/2019  IV Bactrim 09/09/2019   Subjective: Patient states that  she been having multiple episodes of nausea and vomiting yesterday around 5-6 times.  Did have some hallucination and confusion yesterday.  Patient feels that it is her medications as making her sick. Complains of mild shortness of breath with productive sputum.  Objective: Vitals:   09/10/19 2100 09/11/19 0100 09/11/19 0700 09/11/19 0703  BP:  134/84 127/85   Pulse:  (!) 57 72   Resp:  (!) 30 16 16   Temp: 97.9 F (36.6 C) 98.3 F (36.8 C) 98.5 F (36.9  C)   TempSrc: Oral Oral Oral   SpO2:  100% 97%   Weight:      Height:        Intake/Output Summary (Last 24 hours) at 09/11/2019 1026 Last data filed at 09/11/2019 0833 Gross per 24 hour  Intake --  Output 1100 ml  Net -1100 ml   Filed Weights   09/06/19 1552  Weight: 59.9 kg   Body mass index is 24.14 kg/m.    Examination: General:  Average built, not in obvious distress, on nasal cannula 4 L/min. HENT: Normocephalic, pupils equally reacting to light and accommodation.  No scleral pallor or icterus noted. Oral mucosa is moist.  Chest:   Diminished breath sounds bilaterally.  No wheezes noted CVS: S1 &S2 heard. No murmur.  Regular rate and rhythm. Abdomen: Soft, nontender, nondistended.  Bowel sounds are heard.  Liver is not palpable, no abdominal mass palpated Extremities: No cyanosis, clubbing or edema.  Peripheral pulses are palpable. Psych: Alert, awake and oriented, normal mood CNS:  No cranial nerve deficits.  Power equal in all extremities.  No sensory deficits noted.  No cerebellar signs.   Skin: Warm and dry.  No rashes noted.  Data Reviewed: I have personally reviewed following labs and imaging studies  CBC: Recent Labs  Lab 09/07/19 0410 09/08/19 1230 09/09/19 0256 09/10/19 0610 09/11/19 0655  WBC 9.4 18.0* 9.5 19.5* 16.9*  NEUTROABS  --  14.7*  --   --  15.4*  HGB 10.7* 10.1* 9.9* 9.7* 9.3*  HCT 34.7* 31.6* 32.0* 30.8* 29.5*  MCV 86.5 84.7 85.8 86.0 85.5  PLT 400 356 358 401* [id number]*   Basic Metabolic Panel: Recent Labs  Lab 09/06/19 1605 09/06/19 2049 09/07/19 0410 09/08/19 1230 09/09/19 0256 09/10/19 0610 09/11/19 0655  NA 135 137  --  136 138 142 137  K 3.8 4.0  --  3.5 3.7 3.7 3.9  CL 101  --   --  105 105 110 106  CO2 22  --   --  22 24 23 22   GLUCOSE 95  --   --  159* 164* 116* 110*  BUN 8  --   --  <5* 5* <5* 8  CREATININE 0.61  --  0.60 0.62 0.52 0.60 0.57  CALCIUM 9.0  --   --  8.3* 8.3* 8.4* 8.1*  MG  --   --   --   --   --  2.1 2.1   GFR: Estimated Creatinine Clearance: 68 mL/min (by C-G formula based on SCr of 0.57 mg/dL). Liver Function Tests: Recent Labs  Lab 09/07/19 0410 09/08/19 1230 09/09/19 0256 09/10/19 0610 09/11/19 0655  AST 46* 51* 34 83* 78*  ALT 16 13 14  48* 85*  ALKPHOS 50 50 48 51 56  BILITOT 0.4 0.5 0.1* 0.2* 0.4  PROT  7.3 6.8 6.5 6.7 6.4*  ALBUMIN 2.8* 2.3* 2.1* 2.1* 2.1*   No results for input(s): LIPASE, AMYLASE in the last 168 hours. No results for input(s): AMMONIA in the last 168 hours. Coagulation Profile: No results for input(s): INR, PROTIME in the last 168 hours. Cardiac Enzymes: No results for input(s): CKTOTAL, CKMB, CKMBINDEX, TROPONINI in the last 168 hours. BNP (last 3 results) No results for input(s): PROBNP in the last 8760 hours. HbA1C: No results for input(s): HGBA1C in the last 72 hours. CBG: No results for input(s): GLUCAP in the last 168 hours. Lipid Profile: No results for input(s): CHOL, HDL, LDLCALC, TRIG, CHOLHDL, LDLDIRECT in the last  72 hours. Thyroid Function Tests: No results for input(s): TSH, T4TOTAL, FREET4, T3FREE, THYROIDAB in the last 72 hours. Anemia Panel: No results for input(s): VITAMINB12, FOLATE, FERRITIN, TIBC, IRON, RETICCTPCT in the last 72 hours. Sepsis Labs: Recent Labs  Lab 09/07/19 0410 09/08/19 1230  PROCALCITON 0.12 0.44    Recent Results (from the past 240 hour(s))  Novel Coronavirus, NAA ([facility])     Status: None   Collection Time: 09/04/19  9:15 AM   Specimen: Nasopharyngeal(NP) swabs in vial transport medium   NASOPHARYNGE  TESTING  Result Value Ref Range Status   SARS-CoV-2, NAA Not Detected Not Detected Final    Comment: This nucleic acid amplification test was developed and its performance characteristics determined by [employer]. Nucleic acid amplification tests include PCR and TMA. This test has not been FDA cleared or approved. This test has been authorized by FDA under an Emergency Use Authorization (EUA). This test is only authorized for the duration of time the declaration that circumstances exist justifying the authorization of the emergency use of in vitro diagnostic tests for detection of SARS-CoV-2 virus and/or diagnosis of COVID-19 infection under section 564(b)(1) of the Act, 21 U.S.C. [id number]) (1),  unless the authorization is terminated or revoked sooner. When diagnostic testing is negative, the possibility of a false negative result should be considered in the context of a patient's recent exposures and the presence of clinical signs and symptoms consistent with COVID-19. An individual without symptoms of COVID-19 and who is not shedding SARS-CoV-2 virus would  expect to have a negative (not detected) result in this assay.   SARS CORONAVIRUS 2 (TAT 6-24 HRS) Nasopharyngeal Nasopharyngeal Swab     Status: None   Collection Time: 09/07/19 12:07 AM   Specimen: Nasopharyngeal Swab  Result Value Ref Range Status   SARS Coronavirus 2 NEGATIVE NEGATIVE Final    Comment: (NOTE) SARS-CoV-2 target nucleic acids are NOT DETECTED. The SARS-CoV-2 RNA is generally detectable in upper and lower respiratory specimens during the acute phase of infection. Negative results do not preclude SARS-CoV-2 infection, do not rule out co-infections with other pathogens, and should not be used as the sole basis for treatment or other patient management decisions. Negative results must be combined with clinical observations, patient history, and epidemiological information. The expected result is Negative. Fact Sheet for Patients: SugarRoll.be Fact Sheet for Healthcare Providers: https://www.woods-mathews.com/ This test is not yet approved or cleared by the [place] FDA and  has been authorized for detection and/or diagnosis of SARS-CoV-2 by FDA under an Emergency Use Authorization (EUA). This EUA will remain  in effect (meaning this test can be used) for the duration of the COVID-19 declaration under Section 56 4(b)(1) of the Act, 21 U.S.C. section 360bbb-3(b)(1), unless the authorization is terminated or revoked sooner. Performed at [facility], [place] [address], [place], [place] [address]   SARS CORONAVIRUS 2 (TAT 6-24 HRS) Nasopharyngeal  Nasopharyngeal Swab     Status: None   Collection Time: 09/07/19  3:47 PM   Specimen: Nasopharyngeal Swab  Result Value Ref Range Status   SARS Coronavirus 2 NEGATIVE NEGATIVE Final    Comment: (NOTE) SARS-CoV-2 target nucleic acids are NOT DETECTED. The SARS-CoV-2 RNA is generally detectable in upper and lower respiratory specimens during the acute phase of infection. Negative results do not preclude SARS-CoV-2 infection, do not rule out co-infections with other pathogens, and should not be used as the sole basis for treatment or other patient management decisions. Negative results must be  combined with clinical observations, patient history, and epidemiological information. The expected result is Negative. Fact Sheet for Patients: SugarRoll.be Fact Sheet for Healthcare Providers: https://www.woods-mathews.com/ This test is not yet approved or cleared by the [place] FDA and  has been authorized for detection and/or diagnosis of SARS-CoV-2 by FDA under an Emergency Use Authorization (EUA). This EUA will remain  in effect (meaning this test can be used) for the duration of the COVID-19 declaration under Section 56 4(b)(1) of the Act, 21 U.S.C. section 360bbb-3(b)(1), unless the authorization is terminated or revoked sooner. Performed at [facility], [place] [address] [place], [place] [address]   Respiratory Panel by PCR     Status: None   Collection Time: 09/08/19 11:43 AM   Specimen: Nasopharyngeal Swab; Respiratory  Result Value Ref Range Status   Adenovirus NOT DETECTED NOT DETECTED Final   Coronavirus 229E NOT DETECTED NOT DETECTED Final    Comment: (NOTE) The Coronavirus on the Respiratory Panel, DOES NOT test for the novel  Coronavirus (2019 nCoV)    Coronavirus HKU1 NOT DETECTED NOT DETECTED Final   Coronavirus NL63 NOT DETECTED NOT DETECTED Final   Coronavirus OC43 NOT DETECTED NOT DETECTED Final   Metapneumovirus NOT  DETECTED NOT DETECTED Final   Rhinovirus / Enterovirus NOT DETECTED NOT DETECTED Final   Influenza A NOT DETECTED NOT DETECTED Final   Influenza B NOT DETECTED NOT DETECTED Final   Parainfluenza Virus 1 NOT DETECTED NOT DETECTED Final   Parainfluenza Virus 2 NOT DETECTED NOT DETECTED Final   Parainfluenza Virus 3 NOT DETECTED NOT DETECTED Final   Parainfluenza Virus 4 NOT DETECTED NOT DETECTED Final   Respiratory Syncytial Virus NOT DETECTED NOT DETECTED Final   Bordetella pertussis NOT DETECTED NOT DETECTED Final   Chlamydophila pneumoniae NOT DETECTED NOT DETECTED Final   Mycoplasma pneumoniae NOT DETECTED NOT DETECTED Final    Comment: Performed at [facility], [place]. [address] [place], [place] [address]      Radiology Studies: No results found.   Scheduled Meds: . bictegravir-emtricitabine-tenofovir AF  1 tablet Oral Daily  . enoxaparin (LOVENOX) injection  40 mg Subcutaneous Q24H  . guaiFENesin  600 mg Oral BID  . methylPREDNISolone (SOLU-MEDROL) injection  40 mg Intravenous Q12H  . sodium chloride flush  10-40 mL Intracatheter Q12H   Continuous Infusions: . dextrose 5 % and 0.45% NaCl 100 mL/hr at 09/09/19 1131  . sulfamethoxazole-trimethoprim 400 mg of trimethoprim (09/10/19 1401)     LOS: 5 days    Flora Lipps, MD [facility] 09/11/2019, 10:26 AM

## 2019-09-11 NOTE — Telephone Encounter (Signed)
[facility] Patient Advocate Encounter   Was successful in obtaining a [employer] copay card for $7500.  This copay card will make the patients copay 0.  RxBin: [id number] PCN: ACCESS Member ID: [id number] Group ID: [id number]  Deborah Durham Patient [facility] for Infectious Disease Phone: [phone] Fax:  [phone]

## 2019-09-12 LAB — CBC
HCT: 30.1 % — ABNORMAL LOW (ref 36.0–46.0)
Hemoglobin: 9.6 g/dL — ABNORMAL LOW (ref 12.0–15.0)
MCH: 26.5 pg (ref 26.0–34.0)
MCHC: 31.9 g/dL (ref 30.0–36.0)
MCV: 83.1 fL (ref 80.0–100.0)
Platelets: 412 10*3/uL — ABNORMAL HIGH (ref 150–400)
RBC: 3.62 MIL/uL — ABNORMAL LOW (ref 3.87–5.11)
RDW: 14.8 % (ref 11.5–15.5)
WBC: 12.1 10*3/uL — ABNORMAL HIGH (ref 4.0–10.5)
nRBC: 0.3 % — ABNORMAL HIGH (ref 0.0–0.2)

## 2019-09-12 LAB — COMPREHENSIVE METABOLIC PANEL
ALT: 88 U/L — ABNORMAL HIGH (ref 0–44)
AST: 66 U/L — ABNORMAL HIGH (ref 15–41)
Albumin: 2.1 g/dL — ABNORMAL LOW (ref 3.5–5.0)
Alkaline Phosphatase: 61 U/L (ref 38–126)
Anion gap: 11 (ref 5–15)
BUN: 8 mg/dL (ref 6–20)
CO2: 22 mmol/L (ref 22–32)
Calcium: 8.2 mg/dL — ABNORMAL LOW (ref 8.9–10.3)
Chloride: 102 mmol/L (ref 98–111)
Creatinine, Ser: 0.61 mg/dL (ref 0.44–1.00)
GFR calc Af Amer: >60 mL/min (ref >60)
GFR calc non Af Amer: >60 mL/min (ref >60)
Glucose, Bld: 98 mg/dL (ref 70–99)
Potassium: 3.1 mmol/L — ABNORMAL LOW (ref 3.5–5.1)
Sodium: 135 mmol/L (ref 135–145)
Total Bilirubin: 0.3 mg/dL (ref 0.3–1.2)
Total Protein: 6.2 g/dL — ABNORMAL LOW (ref 6.5–8.1)

## 2019-09-12 LAB — PHOSPHORUS: Phosphorus: 2.3 mg/dL — ABNORMAL LOW (ref 2.5–4.6)

## 2019-09-12 LAB — MAGNESIUM: Magnesium: 1.9 mg/dL (ref 1.7–2.4)

## 2019-09-12 MED ORDER — ONDANSETRON HCL 4 MG/2ML IJ SOLN
4.0000 mg | Freq: Four times a day (QID) | INTRAMUSCULAR | Status: DC | PRN
  Administered 2019-09-14 – 2019-09-15 (×3): 4 mg via INTRAVENOUS
  Filled 2019-09-12 (×4): qty 2

## 2019-09-12 MED ORDER — POTASSIUM & SODIUM PHOSPHATES 280-160-250 MG PO PACK
1.0000 | PACK | Freq: Three times a day (TID) | ORAL | Status: DC
  Administered 2019-09-12 – 2019-09-14 (×7): 1 via ORAL
  Filled 2019-09-12 (×8): qty 1

## 2019-09-12 MED ORDER — BLISTEX MEDICATED EX OINT
TOPICAL_OINTMENT | CUTANEOUS | Status: DC | PRN
  Filled 2019-09-12: qty 6.3

## 2019-09-12 MED ORDER — POTASSIUM CHLORIDE CRYS ER 20 MEQ PO TBCR
40.0000 meq | EXTENDED_RELEASE_TABLET | Freq: Once | ORAL | Status: AC
  Administered 2019-09-12: 40 meq via ORAL
  Filled 2019-09-12: qty 2

## 2019-09-12 MED ORDER — LORAZEPAM 1 MG PO TABS
1.0000 mg | ORAL_TABLET | Freq: Four times a day (QID) | ORAL | Status: DC | PRN
  Administered 2019-09-12 – 2019-09-18 (×7): 1 mg via ORAL
  Filled 2019-09-12 (×8): qty 1

## 2019-09-12 NOTE — Progress Notes (Signed)
PROGRESS NOTE    Deborah Durham  [id number] DOB: [birth year] DOA: 09/06/2019 PCP: Patient, No Pcp Per   Brief Narrative:  As per HPI,   Patient is a 49 year old female with no significant past medical history presented to the hospital with  2-week history of shortness of breath, subjective fevers and decreased appetite with dyspnea on exertion with multiple negative Covid tests prior to admission and on admission.  Negative respiratory panel.  Patient was Found to have positive HIV antigen and multifocal pneumonia on CT scan.  She was Started on empiric treatment for pneumocystis pneumonia by pulmonology with Solu-Medrol 40 mg IV twice daily as well as Bactrim and continued on empiric CAP therapy with Rocephin and azithromycin.  ID consulted for HIV eval.  On1/11: Per ID, HIV 1 antibody positive and low CD4 count confirms advanced HIV which has been complicated by suspected pneumocystis pneumonia.  Bactrim and steroids continued, started on Biktarvy. CAP therapy discontinued.   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  On 4 L of nasal cannula at this time. LDH elevated.  Patient received IV Rocephin and azithromycin for probable CAP which has been discontinued.  Continue IV Solu-Medrol and IV Bactrim. Dose of solumedrol has been decreased due to hallucinations.  ID and pulmonary on board.  Leukocytosis  but on steroids, trending down.  Procalcitonin at 0.4 on 09/08/2019.  Continue incentive spirometry, flutter valve. Wean oxygen as able. Urinary Legionella antigen was negative.   Newly diagnosed HIV Absolute CD4 count of less than 35 on 09/09/2019.  Patient currently on IV steroids and IV Bactrim and started on Biktarvy per ID.  ID on board. Indeterminate QuantiFERON test. ANA negative. Mildly elevated rhematoid factor.  Trace pericardial effusion Recommend outpatient follow-up.  Elevated LFT. Could be secondary to HIV.  Hepatitis C antibody nonreactive.  Will follow CMP periodically.  Nausea and vomiting, possible gastritits. Improved today.  Oral Bactrim was changed to IV. On PPI. As per ID, unlikely from antivirals and Bactrim.   On reduced solumedrol dose. On gentle fluids with D5 half-normal saline. Will decrease the rate today.  Mild hypokalemia. Potassium 3.1 today. Received p.o. potassium x1. We will continue p.o. replacement with potassium phosphate.. Check potassium levels in a.m. Magnesium 1.9.  Mild hypophosphatemia. We will continue with oral potassium phosphate..  DVT prophylaxis: Lovenox subcu  Code Status: Full code  Family Communication: I spoke with the patient at bedside. She stated that I could talk to her husband but does not want me to talk to him today.  Disposition Plan:  Home pending clinical improvement.  Follow ID recommendations.  Wean oxygen as able. Still very symptomatic and hypoxic. Will need to mobilize the patient and hopefully ambulate soon. Will request ambulate in the hallway.  Consultants:   Infectious disease: Dr. Megan Salon 09/08/2019  PCCM: Dr. Shearon Stalls 09/08/2019  Procedures:   2D echo 09/08/2019  Chest x-ray 09/06/2019, 09/08/2019  CT angiogram chest 09/06/2019  Antimicrobials:   Biktarvy 09/09/2019  Oral Bactrim 09/08/2019>>> 09/09/2019  IV azithromycin 09/06/2019>>>>> 09/09/2019  IV Rocephin 09/06/2019>> 09/09/2019  IV Bactrim 09/09/2019  Subjective: Today, patient feels  little better.  Denies any nausea, abdominal pain vomiting.  No hallucinations.  Still feels dyspneic and short of breath especially on minimal  exertion.  Objective: Vitals:   09/11/19 2133 09/12/19 0035 09/12/19 0044 09/12/19 0457  BP: (!) 188/79 99/64  103/76  Pulse: 73 74  80  Resp: (!) 21 (!) 30  13  Temp: 98.1 F (36.7 C) 98 F (36.7 C)  98.6 F (37 C)  TempSrc: Oral Oral  Oral  SpO2: 97% 92% 92% 93%  Weight:      Height:        Intake/Output Summary (Last 24 hours) at 09/12/2019 0717 Last data filed at 09/12/2019 0634 Gross per 24 hour  Intake 248 ml  Output 2150 ml  Net -1902 ml   Filed Weights   09/06/19 1552  Weight: 59.9 kg   Body mass index is 24.14 kg/m.   Examination: General:  Average built, mildly tachypneic, on nasal cannula 4 L/min.  Weak voice. HENT: Normocephalic, pupils equally reacting to light and accommodation.  No scleral pallor or icterus noted. Oral mucosa is moist.  Chest:   Diminished breath sounds bilaterally.  No wheezes noted.  Tachypneic CVS: S1 &S2 heard. No murmur.  Regular rate and rhythm. Abdomen: Soft, nontender, nondistended.  Bowel sounds are heard.  Extremities: No cyanosis, clubbing or edema.  Peripheral pulses are palpable. Psych: Alert, awake and oriented, normal mood CNS:  No cranial nerve deficits.  Power equal in all extremities.   Skin: Warm and dry.  No rashes noted.  Data Reviewed: I have personally reviewed following labs and imaging studies  CBC: Recent Labs  Lab 09/08/19 1230 09/09/19 0256 09/10/19 0610 09/11/19 0655 09/12/19 0211  WBC 18.0* 9.5 19.5* 16.9* 12.1*  NEUTROABS 14.7*  --   --  15.4*  --   HGB 10.1* 9.9* 9.7* 9.3* 9.6*  HCT 31.6* 32.0* 30.8* 29.5* 30.1*  MCV 84.7 85.8 86.0 85.5 83.1  PLT 356 358 401* 401* [id number]*   Basic Metabolic Panel: Recent Labs  Lab 09/08/19 1230 09/09/19 0256 09/10/19 0610 09/11/19 0655 09/12/19 0211  NA 136 138 142 137 135  K 3.5 3.7 3.7 3.9 3.1*  CL 105 105 110 106 102  CO2 22 24 23 22 22   GLUCOSE 159* 164* 116* 110* 98  BUN <5* 5* <5* 8 8  CREATININE 0.62 0.52 0.60 0.57 0.61  CALCIUM 8.3* 8.3* 8.4*  8.1* 8.2*  MG  --   --  2.1 2.1 1.9  PHOS  --   --   --   --  2.3*   GFR: Estimated Creatinine Clearance: 68 mL/min (by C-G formula based on SCr of 0.61 mg/dL). Liver Function Tests: Recent Labs  Lab 09/08/19 1230 09/09/19 0256 09/10/19 0610 09/11/19 0655 09/12/19 0211  AST 51* 34 83* 78* 66*  ALT 13 14 48* 85* 88*  ALKPHOS 50 48 51 56 61  BILITOT 0.5 0.1* 0.2* 0.4 0.3  PROT 6.8 6.5 6.7 6.4* 6.2*  ALBUMIN 2.3* 2.1* 2.1* 2.1* 2.1*   No results for input(s): LIPASE, AMYLASE in the last 168 hours. No results for input(s): AMMONIA in the last 168 hours. Coagulation Profile: No results for input(s): INR, PROTIME in the last 168 hours. Cardiac Enzymes: No results for input(s): CKTOTAL, CKMB, CKMBINDEX, TROPONINI in the last 168 hours. BNP (last 3 results) No results for input(s): PROBNP in the last 8760 hours. HbA1C: No results for input(s): HGBA1C in the last 72 hours. CBG: No results for input(s): GLUCAP in  the last 168 hours. Lipid Profile: No results for input(s): CHOL, HDL, LDLCALC, TRIG, CHOLHDL, LDLDIRECT in the last 72 hours. Thyroid Function Tests: No results for input(s): TSH, T4TOTAL, FREET4, T3FREE, THYROIDAB in the last 72 hours. Anemia Panel: No results for input(s): VITAMINB12, FOLATE, FERRITIN, TIBC, IRON, RETICCTPCT in the last 72 hours. Sepsis Labs: Recent Labs  Lab 09/07/19 0410 09/08/19 1230  PROCALCITON 0.12 0.44    Recent Results (from the past 240 hour(s))  Novel Coronavirus, NAA ([facility])     Status: None   Collection Time: 09/04/19  9:15 AM   Specimen: Nasopharyngeal(NP) swabs in vial transport medium   NASOPHARYNGE  TESTING  Result Value Ref Range Status   SARS-CoV-2, NAA Not Detected Not Detected Final    Comment: This nucleic acid amplification test was developed and its performance characteristics determined by [employer]. Nucleic acid amplification tests include PCR and TMA. This test has not been FDA cleared or approved. This  test has been authorized by FDA under an Emergency Use Authorization (EUA). This test is only authorized for the duration of time the declaration that circumstances exist justifying the authorization of the emergency use of in vitro diagnostic tests for detection of SARS-CoV-2 virus and/or diagnosis of COVID-19 infection under section 564(b)(1) of the Act, 21 U.S.C. [id number]) (1), unless the authorization is terminated or revoked sooner. When diagnostic testing is negative, the possibility of a false negative result should be considered in the context of a patient's recent exposures and the presence of clinical signs and symptoms consistent with COVID-19. An individual without symptoms of COVID-19 and who is not shedding SARS-CoV-2 virus would  expect to have a negative (not detected) result in this assay.   SARS CORONAVIRUS 2 (TAT 6-24 HRS) Nasopharyngeal Nasopharyngeal Swab     Status: None   Collection Time: 09/07/19 12:07 AM   Specimen: Nasopharyngeal Swab  Result Value Ref Range Status   SARS Coronavirus 2 NEGATIVE NEGATIVE Final    Comment: (NOTE) SARS-CoV-2 target nucleic acids are NOT DETECTED. The SARS-CoV-2 RNA is generally detectable in upper and lower respiratory specimens during the acute phase of infection. Negative results do not preclude SARS-CoV-2 infection, do not rule out co-infections with other pathogens, and should not be used as the sole basis for treatment or other patient management decisions. Negative results must be combined with clinical observations, patient history, and epidemiological information. The expected result is Negative. Fact Sheet for Patients: SugarRoll.be Fact Sheet for Healthcare Providers: https://www.woods-mathews.com/ This test is not yet approved or cleared by the [place] FDA and  has been authorized for detection and/or diagnosis of SARS-CoV-2 by FDA under an Emergency Use  Authorization (EUA). This EUA will remain  in effect (meaning this test can be used) for the duration of the COVID-19 declaration under Section 56 4(b)(1) of the Act, 21 U.S.C. section 360bbb-3(b)(1), unless the authorization is terminated or revoked sooner. Performed at [facility], [place] [address] [place], [place] [address]   SARS CORONAVIRUS 2 (TAT 6-24 HRS) Nasopharyngeal Nasopharyngeal Swab     Status: None   Collection Time: 09/07/19  3:47 PM   Specimen: Nasopharyngeal Swab  Result Value Ref Range Status   SARS Coronavirus 2 NEGATIVE NEGATIVE Final    Comment: (NOTE) SARS-CoV-2 target nucleic acids are NOT DETECTED. The SARS-CoV-2 RNA is generally detectable in upper and lower respiratory specimens during the acute phase of infection. Negative results do not preclude SARS-CoV-2 infection, do not rule out co-infections with other pathogens, and  should not be used as the sole basis for treatment or other patient management decisions. Negative results must be combined with clinical observations, patient history, and epidemiological information. The expected result is Negative. Fact Sheet for Patients: SugarRoll.be Fact Sheet for Healthcare Providers: https://www.woods-mathews.com/ This test is not yet approved or cleared by the [place] FDA and  has been authorized for detection and/or diagnosis of SARS-CoV-2 by FDA under an Emergency Use Authorization (EUA). This EUA will remain  in effect (meaning this test can be used) for the duration of the COVID-19 declaration under Section 56 4(b)(1) of the Act, 21 U.S.C. section 360bbb-3(b)(1), unless the authorization is terminated or revoked sooner. Performed at [facility], [place] [address] [place], [place] [address]   Respiratory Panel by PCR     Status: None   Collection Time: 09/08/19 11:43 AM   Specimen: Nasopharyngeal Swab; Respiratory  Result Value Ref Range Status     Adenovirus NOT DETECTED NOT DETECTED Final   Coronavirus 229E NOT DETECTED NOT DETECTED Final    Comment: (NOTE) The Coronavirus on the Respiratory Panel, DOES NOT test for the novel  Coronavirus (2019 nCoV)    Coronavirus HKU1 NOT DETECTED NOT DETECTED Final   Coronavirus NL63 NOT DETECTED NOT DETECTED Final   Coronavirus OC43 NOT DETECTED NOT DETECTED Final   Metapneumovirus NOT DETECTED NOT DETECTED Final   Rhinovirus / Enterovirus NOT DETECTED NOT DETECTED Final   Influenza A NOT DETECTED NOT DETECTED Final   Influenza B NOT DETECTED NOT DETECTED Final   Parainfluenza Virus 1 NOT DETECTED NOT DETECTED Final   Parainfluenza Virus 2 NOT DETECTED NOT DETECTED Final   Parainfluenza Virus 3 NOT DETECTED NOT DETECTED Final   Parainfluenza Virus 4 NOT DETECTED NOT DETECTED Final   Respiratory Syncytial Virus NOT DETECTED NOT DETECTED Final   Bordetella pertussis NOT DETECTED NOT DETECTED Final   Chlamydophila pneumoniae NOT DETECTED NOT DETECTED Final   Mycoplasma pneumoniae NOT DETECTED NOT DETECTED Final    Comment: Performed at [facility], [place]. [address], [place], [place] [address]     Radiology Studies: No results found.   Scheduled Meds: . bictegravir-emtricitabine-tenofovir AF  1 tablet Oral Daily  . enoxaparin (LOVENOX) injection  40 mg Subcutaneous Q24H  . guaiFENesin  600 mg Oral BID  . methylPREDNISolone (SOLU-MEDROL) injection  40 mg Intravenous Daily  . pantoprazole  40 mg Oral Daily  . sodium chloride flush  10-40 mL Intracatheter Q12H   Continuous Infusions: . dextrose 5 % and 0.45% NaCl 100 mL/hr at 09/09/19 1131  . sulfamethoxazole-trimethoprim 400 mg of trimethoprim (09/12/19 0625)     LOS: 6 days    Flora Lipps, MD [facility] 09/12/2019,

## 2019-09-12 NOTE — Progress Notes (Signed)
Patient ID: Deborah Durham, female   DOB: 09/14/1970, 49 y.o.   MRN: [id number]         [facility]  Date of Admission:  09/06/2019           Day 4 trimethoprim sulfamethoxazole and steroids ASSESSMENT: She may be a little bit better early in therapy for presumed pneumocystis pneumonia.  She is tolerating her current therapy well.  PLAN: 1. Continue Biktarvy 2. Continue trimethoprim sulfamethoxazole and steroids 3. I encouraged her to try to sit up out of bed today 4. 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 bictegravir-emtricitabine-tenofovir AF  1 tablet Oral Daily  . enoxaparin (LOVENOX) injection  40 mg Subcutaneous Q24H  . guaiFENesin  600 mg Oral BID  . methylPREDNISolone (SOLU-MEDROL) injection  40 mg Intravenous Daily  . pantoprazole  40 mg Oral Daily  . sodium chloride flush  10-40 mL Intracatheter Q12H   Continuous Infusions: . dextrose 5 % and 0.45% NaCl 100 mL/hr at 09/09/19 1131  . sulfamethoxazole-trimethoprim 400 mg of trimethoprim (09/12/19 0625)   PRN Meds:.acetaminophen **OR** acetaminophen, levalbuterol, ondansetron, sodium chloride flush   SUBJECTIVE: Dakota had a much better night last night.  She has not had any further epigastric pain or nausea.  She did not have any more hallucinations.  She is no longer coughing but still feels short of breath.  Review of Systems: Review of Systems  Constitutional: Positive for malaise/fatigue and weight loss. Negative for chills, diaphoresis and fever.  HENT: Negative for congestion and sore throat.   Respiratory: Positive for shortness of breath. Negative for cough, hemoptysis, sputum production and wheezing.     Cardiovascular: Negative for chest pain.  Gastrointestinal: Negative for abdominal pain, diarrhea, heartburn, nausea and vomiting.  Skin: Negative for rash.  Psychiatric/Behavioral: Negative for depression and hallucinations.    No Known Allergies  OBJECTIVE: Vitals:   09/12/19 0035 09/12/19 0044 09/12/19 0457 09/12/19 0949  BP: 99/64  103/76 111/87  Pulse: 74  80 87  Resp: (!) 30  13 18   Temp: 98 F (36.7 C)  98.6 F (37 C) 98.2 F (36.8 C)  TempSrc: Oral  Oral Oral  SpO2: 92% 92% 93% 93%  Weight:      Height:       Body mass index is 24.14 kg/m.  Physical Exam Constitutional:      Comments: She is resting quietly in bed in a darkened room.  HENT:     Mouth/Throat:     Pharynx: No oropharyngeal exudate.  Cardiovascular:     Rate and Rhythm: Normal rate and regular rhythm.     Heart sounds: No murmur.  Pulmonary:     Effort: Pulmonary effort is normal.     Breath sounds: Normal breath sounds. No wheezing, rhonchi or rales.     Comments: She is using supplemental nasal prong oxygen at 4 L/min and saturating well. Abdominal:     General: There is no distension.     Palpations: Abdomen is soft.     Tenderness: There is no abdominal tenderness.  Psychiatric:        Mood and Affect: Mood normal.     Lab Results Lab Results  Component Value  Date   WBC 12.1 (H) 09/12/2019   HGB 9.6 (L) 09/12/2019   HCT 30.1 (L) 09/12/2019   MCV 83.1 09/12/2019   PLT 412 (H) 09/12/2019    Lab Results  Component Value Date   CREATININE 0.61 09/12/2019   BUN 8 09/12/2019   NA 135 09/12/2019   K 3.1 (L) 09/12/2019   CL 102 09/12/2019   CO2 22 09/12/2019    Lab Results  Component Value Date   ALT 88 (H) 09/12/2019   AST 66 (H) 09/12/2019   ALKPHOS 61 09/12/2019   BILITOT 0.3 09/12/2019     Microbiology: Recent Results (from the past 240 hour(s))  Novel Coronavirus, NAA ([facility])     Status: None   Collection Time: 09/04/19  9:15 AM   Specimen: Nasopharyngeal(NP)  swabs in vial transport medium   NASOPHARYNGE  TESTING  Result Value Ref Range Status   SARS-CoV-2, NAA Not Detected Not Detected Final    Comment: This nucleic acid amplification test was developed and its performance characteristics determined by [employer]. Nucleic acid amplification tests include PCR and TMA. This test has not been FDA cleared or approved. This test has been authorized by FDA under an Emergency Use Authorization (EUA). This test is only authorized for the duration of time the declaration that circumstances exist justifying the authorization of the emergency use of in vitro diagnostic tests for detection of SARS-CoV-2 virus and/or diagnosis of COVID-19 infection under section 564(b)(1) of the Act, 21 U.S.C. [id number]) (1), unless the authorization is terminated or revoked sooner. When diagnostic testing is negative, the possibility of a false negative result should be considered in the context of a patient's recent exposures and the presence of clinical signs and symptoms consistent with COVID-19. An individual without symptoms of COVID-19 and who is not shedding SARS-CoV-2 virus would  expect to have a negative (not detected) result in this assay.   SARS CORONAVIRUS 2 (TAT 6-24 HRS) Nasopharyngeal Nasopharyngeal Swab     Status: None   Collection Time: 09/07/19 12:07 AM   Specimen: Nasopharyngeal Swab  Result Value Ref Range Status   SARS Coronavirus 2 NEGATIVE NEGATIVE Final    Comment: (NOTE) SARS-CoV-2 target nucleic acids are NOT DETECTED. The SARS-CoV-2 RNA is generally detectable in upper and lower respiratory specimens during the acute phase of infection. Negative results do not preclude SARS-CoV-2 infection, do not rule out co-infections with other pathogens, and should not be used as the sole basis for treatment or other patient management decisions. Negative results must be combined with clinical observations, patient history, and  epidemiological information. The expected result is Negative. Fact Sheet for Patients: SugarRoll.be Fact Sheet for Healthcare Providers: https://www.woods-mathews.com/ This test is not yet approved or cleared by the [place] FDA and  has been authorized for detection and/or diagnosis of SARS-CoV-2 by FDA under an Emergency Use Authorization (EUA). This EUA will remain  in effect (meaning this test can be used) for the duration of the COVID-19 declaration under Section 56 4(b)(1) of the Act, 21 U.S.C. section 360bbb-3(b)(1), unless the authorization is terminated or revoked sooner. Performed at [facility], [place] [address], [place], [place] [address]   SARS CORONAVIRUS 2 (TAT 6-24 HRS) Nasopharyngeal Nasopharyngeal Swab     Status: None   Collection Time: 09/07/19  3:47 PM   Specimen: Nasopharyngeal Swab  Result Value Ref Range Status   SARS Coronavirus 2 NEGATIVE NEGATIVE Final    Comment: (NOTE) SARS-CoV-2 target nucleic acids are NOT DETECTED.  The SARS-CoV-2 RNA is generally detectable in upper and lower respiratory specimens during the acute phase of infection. Negative results do not preclude SARS-CoV-2 infection, do not rule out co-infections with other pathogens, and should not be used as the sole basis for treatment or other patient management decisions. Negative results must be combined with clinical observations, patient history, and epidemiological information. The expected result is Negative. Fact Sheet for Patients: SugarRoll.be Fact Sheet for Healthcare Providers: https://www.woods-mathews.com/ This test is not yet approved or cleared by the [place] FDA and  has been authorized for detection and/or diagnosis of SARS-CoV-2 by FDA under an Emergency Use Authorization (EUA). This EUA will remain  in effect (meaning this test can be used) for the duration of the COVID-19  declaration under Section 56 4(b)(1) of the Act, 21 U.S.C. section 360bbb-3(b)(1), unless the authorization is terminated or revoked sooner. Performed at [facility], [place] [address], [place], [place] [address]   Respiratory Panel by PCR     Status: None   Collection Time: 09/08/19 11:43 AM   Specimen: Nasopharyngeal Swab; Respiratory  Result Value Ref Range Status   Adenovirus NOT DETECTED NOT DETECTED Final   Coronavirus 229E NOT DETECTED NOT DETECTED Final    Comment: (NOTE) The Coronavirus on the Respiratory Panel, DOES NOT test for the novel  Coronavirus (2019 nCoV)    Coronavirus HKU1 NOT DETECTED NOT DETECTED Final   Coronavirus NL63 NOT DETECTED NOT DETECTED Final   Coronavirus OC43 NOT DETECTED NOT DETECTED Final   Metapneumovirus NOT DETECTED NOT DETECTED Final   Rhinovirus / Enterovirus NOT DETECTED NOT DETECTED Final   Influenza A NOT DETECTED NOT DETECTED Final   Influenza B NOT DETECTED NOT DETECTED Final   Parainfluenza Virus 1 NOT DETECTED NOT DETECTED Final   Parainfluenza Virus 2 NOT DETECTED NOT DETECTED Final   Parainfluenza Virus 3 NOT DETECTED NOT DETECTED Final   Parainfluenza Virus 4 NOT DETECTED NOT DETECTED Final   Respiratory Syncytial Virus NOT DETECTED NOT DETECTED Final   Bordetella pertussis NOT DETECTED NOT DETECTED Final   Chlamydophila pneumoniae NOT DETECTED NOT DETECTED Final   Mycoplasma pneumoniae NOT DETECTED NOT DETECTED Final    Comment: Performed at [facility], [place]. [address], [place], [place] [address]    Michel Bickers, [place] [facility] [facility] [phone] pager   [phone] cell 09/12/2019, 11:57 AM

## 2019-09-13 LAB — CBC
HCT: 32.6 % — ABNORMAL LOW (ref 36.0–46.0)
Hemoglobin: 10.6 g/dL — ABNORMAL LOW (ref 12.0–15.0)
MCH: 26.8 pg (ref 26.0–34.0)
MCHC: 32.5 g/dL (ref 30.0–36.0)
MCV: 82.5 fL (ref 80.0–100.0)
Platelets: 480 10*3/uL — ABNORMAL HIGH (ref 150–400)
RBC: 3.95 MIL/uL (ref 3.87–5.11)
RDW: 14.6 % (ref 11.5–15.5)
WBC: 15 10*3/uL — ABNORMAL HIGH (ref 4.0–10.5)
nRBC: 0.1 % (ref 0.0–0.2)

## 2019-09-13 LAB — BASIC METABOLIC PANEL
Anion gap: 12 (ref 5–15)
BUN: 6 mg/dL (ref 6–20)
CO2: 21 mmol/L — ABNORMAL LOW (ref 22–32)
Calcium: 8.8 mg/dL — ABNORMAL LOW (ref 8.9–10.3)
Chloride: 104 mmol/L (ref 98–111)
Creatinine, Ser: 0.64 mg/dL (ref 0.44–1.00)
GFR calc Af Amer: >60 mL/min (ref >60)
GFR calc non Af Amer: >60 mL/min (ref >60)
Glucose, Bld: 107 mg/dL — ABNORMAL HIGH (ref 70–99)
Potassium: 3.8 mmol/L (ref 3.5–5.1)
Sodium: 137 mmol/L (ref 135–145)

## 2019-09-13 LAB — MAGNESIUM: Magnesium: 2 mg/dL (ref 1.7–2.4)

## 2019-09-13 LAB — MRSA PCR SCREENING: MRSA by PCR: NEGATIVE

## 2019-09-13 MED ORDER — ENOXAPARIN SODIUM 40 MG/0.4ML ~~LOC~~ SOLN
40.0000 mg | Freq: Every day | SUBCUTANEOUS | Status: DC
  Administered 2019-09-13 – 2019-09-20 (×8): 40 mg via SUBCUTANEOUS
  Filled 2019-09-13 (×9): qty 0.4

## 2019-09-13 NOTE — Progress Notes (Signed)
PROGRESS NOTE    KINZLEY ROSTRO  [id number] DOB: 1971-03-04 DOA: 09/06/2019 PCP: Patient, No Pcp Per   Brief Narrative:  As per HPI,   Patient is a 49 year old female with no significant past medical history presented to the hospital with  2-week history of shortness of breath, subjective fevers and decreased appetite with dyspnea on exertion with multiple negative Covid tests prior to admission and on admission.  Negative respiratory panel.  Patient was Found to have positive HIV antigen and multifocal pneumonia on CT scan.  She was Started on empiric treatment for pneumocystis pneumonia by pulmonology with Solu-Medrol 40 mg IV twice daily as well as Bactrim and continued on empiric CAP therapy with Rocephin and azithromycin.  ID consulted for HIV eval.  On1/11: Per ID, HIV 1 antibody positive and low CD4 count confirms advanced HIV which has been complicated by suspected pneumocystis pneumonia.  Bactrim and steroids continued, started on Biktarvy. CAP therapy discontinued.   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  On 5 L of nasal cannula at this time. LDH elevated.  Patient received IV Rocephin and azithromycin for probable CAP which has been discontinued.  Continue IV Solu-Medrol and IV Bactrim. Dose of solumedrol has been decreased due to hallucinations and hallucinations have resolved.  ID  on board.  Leukocytosis  but on steroids, trending down.  Procalcitonin at 0.4 on 09/08/2019.  Continue incentive spirometry, flutter valve. Wean oxygen as able but still on  significant amount of oxygen.   Urinary Legionella antigen was negative.   Newly diagnosed HIV Absolute CD4 count of less than 35 on 09/09/2019.  Patient currently on IV steroids and IV Bactrim and started on Biktarvy per ID.  ID on board. Indeterminate QuantiFERON test. ANA negative. Mildly elevated rheumatoid factor.  Trace pericardial effusion Recommend outpatient follow-up.  Elevated LFT. Could be secondary to HIV.  Hepatitis C antibody nonreactive.  Will follow CMP periodically.  Nausea and vomiting, possible gastritits. Improved. Oral Bactrim was changed to IV. On PPI. As per ID, unlikely from antivirals and Bactrim.   On reduced solumedrol dose. On gentle fluids with D5 half-normal saline. Will dc fluids today.  Mild hypokalemia. Potassium 3.1 on 1/14.Marland Kitchen Received p.o. potassium x1. Todays labs pending.  Check labs in a.m.  Mild hypophosphatemia. We will continue with oral potassium phosphate..  Anxiety secondary to acute illness.  Ativan seemed to help her, we will continue that.  DVT prophylaxis: Lovenox subcu  Code Status: Full code  Family Communication:  None.  Patient states that she is communicating with her family.  Disposition Plan:  Home pending clinical improvement.  Follow ID recommendations.  Wean oxygen as able. Still very symptomatic and hypoxic.  Encouraged the patient to mobilize.  Will get PT evaluation too.  Consultants:   Infectious disease: Dr. Megan Salon 09/08/2019  PCCM: Dr. Shearon Stalls 09/08/2019  Procedures:   2D echo 09/08/2019  Chest x-ray 09/06/2019, 09/08/2019  CT angiogram chest 09/06/2019  Antimicrobials:   Biktarvy 09/09/2019  Oral Bactrim 09/08/2019>>> 09/09/2019  IV azithromycin 09/06/2019>>>>> 09/09/2019  IV Rocephin 09/06/2019>> 09/09/2019  IV Bactrim 09/09/2019  Subjective:  Today, patient feels overall okay.  Denies any nausea vomiting abdominal pain.  Denies hallucinations.   still feels short of breath on exertion.  Patient was really anxious  yesterday and states that Ativan helped her.  Objective: Vitals:   09/13/19 0400 09/13/19 0500 09/13/19 0600 09/13/19 0803  BP: 121/83 105/66 108/70 (!) 132/102  Pulse: 71 77 82 98  Resp: (!) 34 (!) 29 (!) 41 (!) 37  Temp:    97.8 F (36.6 C)  TempSrc:    Oral  SpO2: 100% 99% 97% 100%  Weight:      Height:        Intake/Output Summary (Last 24 hours) at 09/13/2019 0838 Last data filed at 09/12/2019 2301 Gross per 24 hour  Intake 500 ml  Output 1200 ml  Net -700 ml   Filed Weights   09/06/19 1552  Weight: 59.9 kg   Body mass index is 24.14 kg/m.   Examination: General: Alert awake and communicative.  On 5 L of oxygen.  Calm today. HENT: Normocephalic, pupils equally reacting to light and accommodation.  No scleral pallor or icterus noted. Oral mucosa is moist.  Chest:   Diminished breath sounds bilaterally.    CVS: S1 &S2 heard. No murmur.  Regular rate and rhythm. Abdomen: Soft, nontender, nondistended.  Bowel sounds are heard.  Extremities: No cyanosis, clubbing or edema.  Peripheral pulses are palpable. Psych: Alert, awake and oriented, CNS:  No cranial nerve deficits.  Power equal in all extremities.   Skin: Warm and dry.  No rashes noted.  Data Reviewed: I have personally reviewed following labs and imaging studies  CBC: Recent Labs  Lab 09/08/19 1230 09/09/19 0256 09/10/19 0610 09/11/19 0655 09/12/19 0211  WBC 18.0* 9.5 19.5* 16.9* 12.1*  NEUTROABS 14.7*  --   --  15.4*  --   HGB 10.1* 9.9* 9.7* 9.3* 9.6*  HCT 31.6* 32.0* 30.8* 29.5* 30.1*  MCV 84.7 85.8 86.0 85.5 83.1  PLT 356 358 401* 401* [id number]*   Basic Metabolic Panel: Recent Labs  Lab 09/08/19 1230 09/09/19 0256 09/10/19 0610 09/11/19 0655 09/12/19 0211  NA 136 138 142 137 135  K 3.5 3.7 3.7 3.9 3.1*  CL 105 105 110 106 102  CO2 22 24 23 22 22   GLUCOSE 159* 164* 116* 110* 98  BUN <5* 5* <5* 8 8  CREATININE 0.62 0.52 0.60 0.57 0.61  CALCIUM 8.3* 8.3* 8.4* 8.1* 8.2*  MG  --   --  2.1 2.1  1.9  PHOS  --   --   --   --  2.3*   GFR: Estimated Creatinine Clearance: 68 mL/min (by C-G formula based on SCr of 0.61 mg/dL). Liver Function Tests: Recent Labs  Lab 09/08/19 1230 09/09/19 0256 09/10/19 0610 09/11/19 0655 09/12/19 0211  AST 51* 34 83* 78* 66*  ALT 13 14 48* 85* 88*  ALKPHOS 50 48 51 56 61  BILITOT 0.5 0.1* 0.2* 0.4 0.3  PROT 6.8 6.5 6.7 6.4* 6.2*  ALBUMIN 2.3* 2.1* 2.1* 2.1* 2.1*   No results for input(s): LIPASE, AMYLASE in the last 168 hours. No results for input(s): AMMONIA in the last 168 hours. Coagulation Profile: No results for input(s): INR, PROTIME in the last 168 hours. Cardiac Enzymes: No results for input(s): CKTOTAL, CKMB, CKMBINDEX, TROPONINI in the last 168 hours. BNP (last 3 results) No results for input(s): PROBNP in the last 8760 hours. HbA1C: No results for input(s): HGBA1C in the last 72 hours. CBG: No results for  input(s): GLUCAP in the last 168 hours. Lipid Profile: No results for input(s): CHOL, HDL, LDLCALC, TRIG, CHOLHDL, LDLDIRECT in the last 72 hours. Thyroid Function Tests: No results for input(s): TSH, T4TOTAL, FREET4, T3FREE, THYROIDAB in the last 72 hours. Anemia Panel: No results for input(s): VITAMINB12, FOLATE, FERRITIN, TIBC, IRON, RETICCTPCT in the last 72 hours. Sepsis Labs: Recent Labs  Lab 09/07/19 0410 09/08/19 1230  PROCALCITON 0.12 0.44    Recent Results (from the past 240 hour(s))  Novel Coronavirus, NAA ([facility])     Status: None   Collection Time: 09/04/19  9:15 AM   Specimen: Nasopharyngeal(NP) swabs in vial transport medium   NASOPHARYNGE  TESTING  Result Value Ref Range Status   SARS-CoV-2, NAA Not Detected Not Detected Final    Comment: This nucleic acid amplification test was developed and its performance characteristics determined by [employer]. Nucleic acid amplification tests include PCR and TMA. This test has not been FDA cleared or approved. This test has been authorized by FDA  under an Emergency Use Authorization (EUA). This test is only authorized for the duration of time the declaration that circumstances exist justifying the authorization of the emergency use of in vitro diagnostic tests for detection of SARS-CoV-2 virus and/or diagnosis of COVID-19 infection under section 564(b)(1) of the Act, 21 U.S.C. [id number]) (1), unless the authorization is terminated or revoked sooner. When diagnostic testing is negative, the possibility of a false negative result should be considered in the context of a patient's recent exposures and the presence of clinical signs and symptoms consistent with COVID-19. An individual without symptoms of COVID-19 and who is not shedding SARS-CoV-2 virus would  expect to have a negative (not detected) result in this assay.   SARS CORONAVIRUS 2 (TAT 6-24 HRS) Nasopharyngeal Nasopharyngeal Swab     Status: None   Collection Time: 09/07/19 12:07 AM   Specimen: Nasopharyngeal Swab  Result Value Ref Range Status   SARS Coronavirus 2 NEGATIVE NEGATIVE Final    Comment: (NOTE) SARS-CoV-2 target nucleic acids are NOT DETECTED. The SARS-CoV-2 RNA is generally detectable in upper and lower respiratory specimens during the acute phase of infection. Negative results do not preclude SARS-CoV-2 infection, do not rule out co-infections with other pathogens, and should not be used as the sole basis for treatment or other patient management decisions. Negative results must be combined with clinical observations, patient history, and epidemiological information. The expected result is Negative. Fact Sheet for Patients: SugarRoll.be Fact Sheet for Healthcare Providers: https://www.woods-mathews.com/ This test is not yet approved or cleared by the [place] FDA and  has been authorized for detection and/or diagnosis of SARS-CoV-2 by FDA under an Emergency Use Authorization (EUA). This EUA will remain    in effect (meaning this test can be used) for the duration of the COVID-19 declaration under Section 56 4(b)(1) of the Act, 21 U.S.C. section 360bbb-3(b)(1), unless the authorization is terminated or revoked sooner. Performed at [facility], [place] [address], [place], [place] [address]   SARS CORONAVIRUS 2 (TAT 6-24 HRS) Nasopharyngeal Nasopharyngeal Swab     Status: None   Collection Time: 09/07/19  3:47 PM   Specimen: Nasopharyngeal Swab  Result Value Ref Range Status   SARS Coronavirus 2 NEGATIVE NEGATIVE Final    Comment: (NOTE) SARS-CoV-2 target nucleic acids are NOT DETECTED. The SARS-CoV-2 RNA is generally detectable in upper and lower respiratory specimens during the acute phase of infection. Negative results do not preclude SARS-CoV-2 infection, do not rule out co-infections  with other pathogens, and should not be used as the sole basis for treatment or other patient management decisions. Negative results must be combined with clinical observations, patient history, and epidemiological information. The expected result is Negative. Fact Sheet for Patients: SugarRoll.be Fact Sheet for Healthcare Providers: https://www.woods-mathews.com/ This test is not yet approved or cleared by the [place] FDA and  has been authorized for detection and/or diagnosis of SARS-CoV-2 by FDA under an Emergency Use Authorization (EUA). This EUA will remain  in effect (meaning this test can be used) for the duration of the COVID-19 declaration under Section 56 4(b)(1) of the Act, 21 U.S.C. section 360bbb-3(b)(1), unless the authorization is terminated or revoked sooner. Performed at [facility], [place] [address], [place], [place] [address]   Respiratory Panel by PCR     Status: None   Collection Time: 09/08/19 11:43 AM   Specimen: Nasopharyngeal Swab; Respiratory  Result Value Ref Range Status   Adenovirus NOT DETECTED NOT DETECTED  Final   Coronavirus 229E NOT DETECTED NOT DETECTED Final    Comment: (NOTE) The Coronavirus on the Respiratory Panel, DOES NOT test for the novel  Coronavirus (2019 nCoV)    Coronavirus HKU1 NOT DETECTED NOT DETECTED Final   Coronavirus NL63 NOT DETECTED NOT DETECTED Final   Coronavirus OC43 NOT DETECTED NOT DETECTED Final   Metapneumovirus NOT DETECTED NOT DETECTED Final   Rhinovirus / Enterovirus NOT DETECTED NOT DETECTED Final   Influenza A NOT DETECTED NOT DETECTED Final   Influenza B NOT DETECTED NOT DETECTED Final   Parainfluenza Virus 1 NOT DETECTED NOT DETECTED Final   Parainfluenza Virus 2 NOT DETECTED NOT DETECTED Final   Parainfluenza Virus 3 NOT DETECTED NOT DETECTED Final   Parainfluenza Virus 4 NOT DETECTED NOT DETECTED Final   Respiratory Syncytial Virus NOT DETECTED NOT DETECTED Final   Bordetella pertussis NOT DETECTED NOT DETECTED Final   Chlamydophila pneumoniae NOT DETECTED NOT DETECTED Final   Mycoplasma pneumoniae NOT DETECTED NOT DETECTED Final    Comment: Performed at [facility], [place]. [address] [place], [place] [address]  MRSA PCR Screening     Status: None   Collection Time: 09/13/19  3:54 AM   Specimen: Nasopharyngeal  Result Value Ref Range Status   MRSA by PCR NEGATIVE NEGATIVE Final    Comment:        The GeneXpert MRSA Assay (FDA approved for NASAL specimens only), is one component of a comprehensive MRSA colonization surveillance program. It is not intended to diagnose MRSA infection nor to guide or monitor treatment for MRSA infections. Performed at [facility], [place] [address], [place],  [address]      Radiology Studies: No results found.   Scheduled Meds: . bictegravir-emtricitabine-tenofovir AF  1 tablet Oral Daily  . guaiFENesin  600 mg Oral BID  . methylPREDNISolone (SOLU-MEDROL) injection  40 mg Intravenous Daily  . pantoprazole  40 mg Oral Daily  . potassium & sodium phosphates  1 packet Oral TID WC & HS    . sodium chloride flush  10-40 mL Intracatheter Q12H   Continuous Infusions: . dextrose 5 % and 0.45% NaCl 75 mL/hr at 09/12/19 1542  . sulfamethoxazole-trimethoprim 400 mg of trimethoprim (09/13/19 0556)     LOS: 7 days    Flora Lipps, MD [facility] 09/13/2019,

## 2019-09-13 NOTE — Progress Notes (Signed)
Rehab Admissions Coordinator Note:  Patient was screened by Cleatrice Burke for appropriateness for an Inpatient Acute Rehab Consult per PT recs. .  At this time, we are recommending Inpatient Rehab consult. I will place order per protocol. I will also place order for OT eval which will be needed.  Cleatrice Burke RN MSN 09/13/2019, 4:00 PM  I can be reached at [phone].

## 2019-09-13 NOTE — Evaluation (Signed)
Physical Therapy Evaluation Patient Details Name: Deborah Durham MRN: [id number] DOB: [birth year] Today's Date: 09/13/2019   History of Present Illness  49 y.o. F admitted for 2-week history of SOB, fevers, and decreased appetite. 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 No significant PMH.  Clinical Impression  Pt admitted for above. She has deficits in strength, endurance, balance, and overall functional mobility. At this time, she seems to be most limited by respiratory status. As described below, she required 6-15L of O2 to keep sats up during activity. She dropped as low as 71% upon reaching EOB but slowly recovered to <88% after increasing her oxygen and encouraging her to take slower, deeper breaths. Described diaphragmatic breathing technique, but she had difficulty with this. RR consistently above 25 breaths per min, hovering around 38-45 for most of session. HR 100-107 at rest, rose to 140s with activity. RN present and aware. Pt is very anxious about both movement and becoming SOB. Recommend CIR at this time since she has a solid discharge plan with available help at home, but would benefit from continued rehab in order to increase independence and overall functional mobility. She would benefit from continued skilled acute PT intervention in order to address deficits.     Follow Up Recommendations CIR    Equipment Recommendations  Other (comment)(TBD next venue)    Recommendations for Other Services Rehab consult     Precautions / Restrictions Precautions Precautions: None Restrictions Weight Bearing Restrictions: No      Mobility  Bed Mobility Overal bed mobility: Needs Assistance Bed Mobility: Supine to Sit     Supine to sit: Min guard;HOB elevated     General bed mobility comments: min guard for safety but with increased time and effort; pt anxious and HR up to 145, SpO2 down to 83% on 6L. Eventually increased to 10 L to help sats  recover.  Transfers Overall transfer level: Needs assistance Equipment used: Rolling walker (2 wheeled) Transfers: Sit to/from [facility] Sit to Stand: Min guard Stand pivot transfers: Min guard       General transfer comment: min guard for safety, with increased time and effort. Pt on 15 L for transfer since sats were at 71% at the lowest. Put back on 8L.  Ambulation/Gait                Stairs            Wheelchair Mobility    Modified Rankin (Stroke Patients Only)       Balance Overall balance assessment: Needs assistance Sitting-balance support: Feet unsupported;Single extremity supported Sitting balance-Leahy Scale: Good Sitting balance - Comments: pt kept one hand on mattress because SOB, not due to balance, supervision   Standing balance support: Bilateral upper extremity supported;During functional activity Standing balance-Leahy Scale: Fair Standing balance comment: pt able to stand, RW used more for SOB                             Pertinent Vitals/Pain Pain Assessment: No/denies pain    Home Living Family/patient expects to be discharged to:: Private residence Living Arrangements: Children;Spouse/significant other;Parent Available Help at Discharge: Family Type of Home: House Home Access: Stairs to enter Entrance Stairs-Rails: None Entrance Stairs-Number of Steps: 2 Home Layout: One level Home Equipment: Grab bars - tub/shower      Prior Function Level of Independence: Needs assistance   Gait / Transfers Assistance Needed: she states that  in past couple of weeks, son helped her walk around the house due to SOB; no assistance with transfers  ADL's / Homemaking Assistance Needed: son helps with getting dressed/bathed, etc.        Hand Dominance   Dominant Hand: Right    Extremity/Trunk Assessment   Upper Extremity Assessment Upper Extremity Assessment: Overall WFL for tasks assessed    Lower Extremity  Assessment Lower Extremity Assessment: Overall WFL for tasks assessed    Cervical / Trunk Assessment Cervical / Trunk Assessment: Normal  Communication   Communication: No difficulties  Cognition Arousal/Alertness: Awake/alert Behavior During Therapy: Anxious Overall Cognitive Status: Within Functional Limits for tasks assessed                                        General Comments      Exercises     Assessment/Plan    PT Assessment Patient needs continued PT services  PT Problem List Decreased strength;Decreased activity tolerance;Decreased balance;Decreased mobility;Decreased knowledge of use of DME;Decreased safety awareness;Cardiopulmonary status limiting activity       PT Treatment Interventions DME instruction;Gait training;Stair training;Functional mobility training;Therapeutic activities;Therapeutic exercise;Balance training;Patient/family education;Modalities    PT Goals (Current goals can be found in the Care Plan section)  Acute Rehab PT Goals Patient Stated Goal: go home, breathe better PT Goal Formulation: With patient Time For Goal Achievement: 09/27/19 Potential to Achieve Goals: Fair    Frequency Min 3X/week   Barriers to discharge        Co-evaluation               AM-PAC PT "6 Clicks" Mobility  Outcome Measure Help needed turning from your back to your side while in a flat bed without using bedrails?: A Little Help needed moving from lying on your back to sitting on the side of a flat bed without using bedrails?: A Little Help needed moving to and from a bed to a chair (including a wheelchair)?: A Little Help needed standing up from a chair using your arms (e.g., wheelchair or bedside chair)?: A Little Help needed to walk in hospital room?: A Little Help needed climbing 3-5 steps with a railing? : A Lot 6 Click Score: 17    End of Session Equipment Utilized During Treatment: Gait belt;Oxygen Activity Tolerance: Patient  limited by fatigue Patient left: in chair;with call bell/phone within reach Nurse Communication: Mobility status PT Visit Diagnosis: Unsteadiness on feet (R26.81);Other abnormalities of gait and mobility (R26.89);Muscle weakness (generalized) (M62.81)    Time: [id number] PT Time Calculation (min) (ACUTE ONLY): 32 min   Charges:   PT Evaluation $PT Eval Moderate Complexity: 1 Mod PT Treatments $Therapeutic Activity: 8-22 mins        Christel Mormon, SPT   Silvana Newness 09/13/2019, 3:44 PM

## 2019-09-13 NOTE — Progress Notes (Signed)
Patient ID: Deborah Durham, female   DOB: 1971/06/30, 49 y.o.   MRN: [id number]         [facility]  Date of Admission:  09/06/2019           Day 5 trimethoprim sulfamethoxazole and steroids ASSESSMENT: I think she is showing some improvement on empiric therapy for pneumocystis pneumonia.  PLAN: 1. Continue Biktarvy 2. Continue trimethoprim sulfamethoxazole and steroids 3. I asked her nurse to get her out of bed today 4. Await results of HIV viral load 5. Please call Dr. 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 bictegravir-emtricitabine-tenofovir AF  1 tablet Oral Daily  . guaiFENesin  600 mg Oral BID  . methylPREDNISolone (SOLU-MEDROL) injection  40 mg Intravenous Daily  . pantoprazole  40 mg Oral Daily  . potassium & sodium phosphates  1 packet Oral TID WC & HS  . sodium chloride flush  10-40 mL Intracatheter Q12H   Continuous Infusions: . dextrose 5 % and 0.45% NaCl 75 mL/hr at 09/12/19 1542  . sulfamethoxazole-trimethoprim 400 mg of trimethoprim (09/13/19 0556)   PRN Meds:.acetaminophen **OR** acetaminophen, levalbuterol, lip balm, LORazepam, ondansetron, sodium chloride flush   SUBJECTIVE: Daffany says that she is feeling better today.  Nausea, abdominal pain or hallucinations.  She says that she did get anxious yesterday when she sat up and felt short of breath. Review of Systems: Review of Systems  Constitutional: Positive for malaise/fatigue and weight loss. Negative for chills, diaphoresis and fever.  HENT: Negative for congestion and sore throat.   Respiratory: Positive for shortness of breath. Negative for cough,  hemoptysis, sputum production and wheezing.   Cardiovascular: Negative for chest pain.  Gastrointestinal: Negative for abdominal pain, diarrhea, heartburn, nausea and vomiting.  Skin: Negative for rash.  Psychiatric/Behavioral: Negative for depression and hallucinations.    No Known Allergies  OBJECTIVE: Vitals:   09/13/19 0300 09/13/19 0400 09/13/19 0500 09/13/19 0600  BP: 121/79 121/83 105/66 108/70  Pulse: 71 71 77 82  Resp: (!) 23 (!) 34 (!) 29 (!) 41  Temp:      TempSrc:      SpO2: 99% 100% 99% 97%  Weight:      Height:       Body mass index is 24.14 kg/m.  Physical Exam Constitutional:      Comments: She is resting quietly in bed in a darkened room.  HENT:     Mouth/Throat:     Pharynx: No oropharyngeal exudate.  Cardiovascular:     Rate and Rhythm: Normal rate and regular rhythm.     Heart sounds: No murmur.  Pulmonary:     Effort: Pulmonary effort is normal.     Breath sounds: Normal breath sounds. No wheezing, rhonchi or rales.     Comments: She is using supplemental nasal prong oxygen at 4 L/min and saturating well. Abdominal:     General: There is no distension.     Palpations: Abdomen is soft.     Tenderness: There is no abdominal tenderness.  Psychiatric:        Mood and Affect: Mood normal.     Lab Results Lab Results  Component Value Date  WBC 12.1 (H) 09/12/2019   HGB 9.6 (L) 09/12/2019   HCT 30.1 (L) 09/12/2019   MCV 83.1 09/12/2019   PLT 412 (H) 09/12/2019    Lab Results  Component Value Date   CREATININE 0.61 09/12/2019   BUN 8 09/12/2019   NA 135 09/12/2019   K 3.1 (L) 09/12/2019   CL 102 09/12/2019   CO2 22 09/12/2019    Lab Results  Component Value Date   ALT 88 (H) 09/12/2019   AST 66 (H) 09/12/2019   ALKPHOS 61 09/12/2019   BILITOT 0.3 09/12/2019     Microbiology: Recent Results (from the past 240 hour(s))  Novel Coronavirus, NAA ([facility])     Status: None   Collection Time: 09/04/19  9:15 AM   Specimen:  Nasopharyngeal(NP) swabs in vial transport medium   NASOPHARYNGE  TESTING  Result Value Ref Range Status   SARS-CoV-2, NAA Not Detected Not Detected Final    Comment: This nucleic acid amplification test was developed and its performance characteristics determined by [employer]. Nucleic acid amplification tests include PCR and TMA. This test has not been FDA cleared or approved. This test has been authorized by FDA under an Emergency Use Authorization (EUA). This test is only authorized for the duration of time the declaration that circumstances exist justifying the authorization of the emergency use of in vitro diagnostic tests for detection of SARS-CoV-2 virus and/or diagnosis of COVID-19 infection under section 564(b)(1) of the Act, 21 U.S.C. [id number]) (1), unless the authorization is terminated or revoked sooner. When diagnostic testing is negative, the possibility of a false negative result should be considered in the context of a patient's recent exposures and the presence of clinical signs and symptoms consistent with COVID-19. An individual without symptoms of COVID-19 and who is not shedding SARS-CoV-2 virus would  expect to have a negative (not detected) result in this assay.   SARS CORONAVIRUS 2 (TAT 6-24 HRS) Nasopharyngeal Nasopharyngeal Swab     Status: None   Collection Time: 09/07/19 12:07 AM   Specimen: Nasopharyngeal Swab  Result Value Ref Range Status   SARS Coronavirus 2 NEGATIVE NEGATIVE Final    Comment: (NOTE) SARS-CoV-2 target nucleic acids are NOT DETECTED. The SARS-CoV-2 RNA is generally detectable in upper and lower respiratory specimens during the acute phase of infection. Negative results do not preclude SARS-CoV-2 infection, do not rule out co-infections with other pathogens, and should not be used as the sole basis for treatment or other patient management decisions. Negative results must be combined with clinical observations, patient  history, and epidemiological information. The expected result is Negative. Fact Sheet for Patients: SugarRoll.be Fact Sheet for Healthcare Providers: https://www.woods-mathews.com/ This test is not yet approved or cleared by the [place] FDA and  has been authorized for detection and/or diagnosis of SARS-CoV-2 by FDA under an Emergency Use Authorization (EUA). This EUA will remain  in effect (meaning this test can be used) for the duration of the COVID-19 declaration under Section 56 4(b)(1) of the Act, 21 U.S.C. section 360bbb-3(b)(1), unless the authorization is terminated or revoked sooner. Performed at [facility], [place] [address], [place], [place] [address]   SARS CORONAVIRUS 2 (TAT 6-24 HRS) Nasopharyngeal Nasopharyngeal Swab     Status: None   Collection Time: 09/07/19  3:47 PM   Specimen: Nasopharyngeal Swab  Result Value Ref Range Status   SARS Coronavirus 2 NEGATIVE NEGATIVE Final    Comment: (NOTE) SARS-CoV-2 target nucleic acids are NOT DETECTED. The SARS-CoV-2 RNA  is generally detectable in upper and lower respiratory specimens during the acute phase of infection. Negative results do not preclude SARS-CoV-2 infection, do not rule out co-infections with other pathogens, and should not be used as the sole basis for treatment or other patient management decisions. Negative results must be combined with clinical observations, patient history, and epidemiological information. The expected result is Negative. Fact Sheet for Patients: SugarRoll.be Fact Sheet for Healthcare Providers: https://www.woods-mathews.com/ This test is not yet approved or cleared by the [place] FDA and  has been authorized for detection and/or diagnosis of SARS-CoV-2 by FDA under an Emergency Use Authorization (EUA). This EUA will remain  in effect (meaning this test can be used) for the duration of  the COVID-19 declaration under Section 56 4(b)(1) of the Act, 21 U.S.C. section 360bbb-3(b)(1), unless the authorization is terminated or revoked sooner. Performed at [facility], [place] [address], [place], [place] [address]   Respiratory Panel by PCR     Status: None   Collection Time: 09/08/19 11:43 AM   Specimen: Nasopharyngeal Swab; Respiratory  Result Value Ref Range Status   Adenovirus NOT DETECTED NOT DETECTED Final   Coronavirus 229E NOT DETECTED NOT DETECTED Final    Comment: (NOTE) The Coronavirus on the Respiratory Panel, DOES NOT test for the novel  Coronavirus (2019 nCoV)    Coronavirus HKU1 NOT DETECTED NOT DETECTED Final   Coronavirus NL63 NOT DETECTED NOT DETECTED Final   Coronavirus OC43 NOT DETECTED NOT DETECTED Final   Metapneumovirus NOT DETECTED NOT DETECTED Final   Rhinovirus / Enterovirus NOT DETECTED NOT DETECTED Final   Influenza A NOT DETECTED NOT DETECTED Final   Influenza B NOT DETECTED NOT DETECTED Final   Parainfluenza Virus 1 NOT DETECTED NOT DETECTED Final   Parainfluenza Virus 2 NOT DETECTED NOT DETECTED Final   Parainfluenza Virus 3 NOT DETECTED NOT DETECTED Final   Parainfluenza Virus 4 NOT DETECTED NOT DETECTED Final   Respiratory Syncytial Virus NOT DETECTED NOT DETECTED Final   Bordetella pertussis NOT DETECTED NOT DETECTED Final   Chlamydophila pneumoniae NOT DETECTED NOT DETECTED Final   Mycoplasma pneumoniae NOT DETECTED NOT DETECTED Final    Comment: Performed at [facility], [place]. [address], [place], [place] [address]  MRSA PCR Screening     Status: None   Collection Time: 09/13/19  3:54 AM   Specimen: Nasopharyngeal  Result Value Ref Range Status   MRSA by PCR NEGATIVE NEGATIVE Final    Comment:        The GeneXpert MRSA Assay (FDA approved for NASAL specimens only), is one component of a comprehensive MRSA colonization surveillance program. It is not intended to diagnose MRSA infection nor to guide or monitor  treatment for MRSA infections. Performed at [facility], [place] [address] [place], [place] [address]     Michel Bickers, [place] [facility] [phone] pager   [phone] cell 09/13/2019, 7:46 AMPatient ID: Deborah Durham, female   DOB: [birth year], 49 y.o.   MRN: [id number]

## 2019-09-14 ENCOUNTER — Inpatient Hospital Stay (HOSPITAL_COMMUNITY): Payer: BC Managed Care – PPO

## 2019-09-14 LAB — CBC
HCT: 30.9 % — ABNORMAL LOW (ref 36.0–46.0)
Hemoglobin: 10.2 g/dL — ABNORMAL LOW (ref 12.0–15.0)
MCH: 27.6 pg (ref 26.0–34.0)
MCHC: 33 g/dL (ref 30.0–36.0)
MCV: 83.5 fL (ref 80.0–100.0)
Platelets: 466 10*3/uL — ABNORMAL HIGH (ref 150–400)
RBC: 3.7 MIL/uL — ABNORMAL LOW (ref 3.87–5.11)
RDW: 14.9 % (ref 11.5–15.5)
WBC: 12.9 10*3/uL — ABNORMAL HIGH (ref 4.0–10.5)
nRBC: 0 % (ref 0.0–0.2)

## 2019-09-14 LAB — COMPREHENSIVE METABOLIC PANEL
ALT: 63 U/L — ABNORMAL HIGH (ref 0–44)
AST: 38 U/L (ref 15–41)
Albumin: 2.4 g/dL — ABNORMAL LOW (ref 3.5–5.0)
Alkaline Phosphatase: 60 U/L (ref 38–126)
Anion gap: 14 (ref 5–15)
BUN: 8 mg/dL (ref 6–20)
CO2: 20 mmol/L — ABNORMAL LOW (ref 22–32)
Calcium: 8.8 mg/dL — ABNORMAL LOW (ref 8.9–10.3)
Chloride: 96 mmol/L — ABNORMAL LOW (ref 98–111)
Creatinine, Ser: 0.69 mg/dL (ref 0.44–1.00)
GFR calc Af Amer: >60 mL/min (ref >60)
GFR calc non Af Amer: >60 mL/min (ref >60)
Glucose, Bld: 179 mg/dL — ABNORMAL HIGH (ref 70–99)
Potassium: 4.3 mmol/L (ref 3.5–5.1)
Sodium: 130 mmol/L — ABNORMAL LOW (ref 135–145)
Total Bilirubin: 0.2 mg/dL — ABNORMAL LOW (ref 0.3–1.2)
Total Protein: 7.1 g/dL (ref 6.5–8.1)

## 2019-09-14 LAB — MAGNESIUM: Magnesium: 1.9 mg/dL (ref 1.7–2.4)

## 2019-09-14 LAB — PHOSPHORUS: Phosphorus: 3.4 mg/dL (ref 2.5–4.6)

## 2019-09-14 MED ORDER — K PHOS MONO-SOD PHOS DI & MONO 155-852-130 MG PO TABS
500.0000 mg | ORAL_TABLET | Freq: Three times a day (TID) | ORAL | Status: DC
  Administered 2019-09-14 – 2019-09-15 (×2): 500 mg via ORAL
  Filled 2019-09-14 (×4): qty 2

## 2019-09-14 MED ORDER — POTASSIUM PHOSPHATE MONOBASIC 500 MG PO TABS
500.0000 mg | ORAL_TABLET | Freq: Three times a day (TID) | ORAL | Status: DC
  Filled 2019-09-14 (×3): qty 1

## 2019-09-14 MED ORDER — PANTOPRAZOLE SODIUM 40 MG IV SOLR
40.0000 mg | INTRAVENOUS | Status: DC
  Administered 2019-09-15 – 2019-09-20 (×5): 40 mg via INTRAVENOUS
  Filled 2019-09-14 (×6): qty 40

## 2019-09-14 NOTE — Progress Notes (Addendum)
PROGRESS NOTE    Deborah Durham  [id number] DOB: 17-Jul-1971 DOA: 09/06/2019 PCP: Patient, No Pcp Per   Brief Narrative:  As per HPI,   Patient is a 49 year old female with no significant past medical history presented to the hospital with  2-week history of shortness of breath, subjective fevers and decreased appetite with dyspnea on exertion with multiple negative Covid tests prior to admission and on admission.  Negative respiratory panel.  Patient was Found to have positive HIV antigen and multifocal pneumonia on CT scan.  She was Started on empiric treatment for pneumocystis pneumonia by pulmonology with Solu-Medrol 40 mg IV twice daily as well as Bactrim and continued on empiric CAP therapy with Rocephin and azithromycin.  ID consulted for HIV eval.  On1/11: Per ID, HIV 1 antibody positive and low CD4 count confirms advanced HIV which has been complicated by suspected pneumocystis pneumonia.  Bactrim and steroids continued, started on Biktarvy. CAP therapy discontinued.   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  On 7 L of nasal cannula at this time from 5L yesterday.  Oxygen requirement has increased.  Unsure etiology.  Repeat chest x-ray from 09/14/2019 showed persistent multilobar bilateral pneumonia.  Off IV fluids.  Not on antibiotics.  Continue IV Solu-Medrol and IV Bactrim. Dose of solumedrol has been decreased due to hallucinations and hallucinations have resolved.  ID  on board.  Leukocytosis  but on steroids.  Procalcitonin at 0.4 on 09/08/2019.  Continue incentive  spirometry, flutter valve. Wean oxygen as able but still on significant amount of oxygen, today at 7/min.   Urinary Legionella antigen was negative.    Newly diagnosed HIV Absolute CD4 count of less than 35 on 09/09/2019.  Patient currently on IV steroids and IV Bactrim and started on Biktarvy per ID.  ID on board. Indeterminate QuantiFERON test. ANA negative. Mildly elevated rheumatoid factor.  Trace pericardial effusion Recommend outpatient follow-up.  Elevated LFT. Could be secondary to HIV.  Hepatitis C antibody nonreactive.  Will follow CMP periodically. Last AST of 66 and ALT of 88  Nausea and vomiting, possible gastritits. Patient had further episodes of nausea and vomiting overnight.  On IV Bactrim.  Change to oral PPI to IV.  As per ID, unlikely from antivirals and Bactrim.   On reduced solumedrol dose.  Will discontinue oral phosphate supplement packet but change to oral tab if tolerated.  Check labs today.  Mild hypokalemia. Improved.  Mild hypophosphatemia. On oral potassium phosphate..  Anxiety secondary to acute illness.  Ativan PRN  DVT prophylaxis: Lovenox subcu  Code Status: Full code  Family Communication:  None.  Patient states that she is communicating with her family.  Disposition Plan:   PT recommending CIR, Pending clinical improvement.  Follow ID recommendations.  Wean oxygen as able patient is a still symptomatic and hypoxic with increased oxygen requirement.  Consultants:   Infectious disease: Dr. Megan Salon 09/08/2019  PCCM: Dr. Shearon Stalls 09/08/2019  Procedures:   2D echo 09/08/2019  Chest x-ray 09/06/2019, 09/08/2019  CT angiogram chest 09/06/2019  Antimicrobials:   Biktarvy 09/09/2019  Oral Bactrim 09/08/2019>>> 09/09/2019  IV azithromycin 09/06/2019>>>>> 09/09/2019  IV Rocephin 09/06/2019>> 09/09/2019  IV Bactrim 09/09/2019  Subjective: Today, patient stated that that she feels nauseated and had no fever chills or vomiting again.  Denies abdominal pain.   Still feels dyspneic and short of breath even on minimal exertion.   Objective: Vitals:   09/13/19 1000 09/13/19 1738 09/13/19 2100 09/14/19 0005  BP: 121/82 117/86 123/84 120/76  Pulse: 93  86 90  Resp: 19  (!) 22 20  Temp:  97.8 F (36.6 C) 97.8 F (36.6 C) 97.8 F (36.6 C)  TempSrc:  Oral Oral Oral  SpO2: 100%  100% 100%  Weight:      Height:        Intake/Output Summary (Last 24 hours) at 09/14/2019 0715 Last data filed at 09/13/2019 2202 Gross per 24 hour  Intake 10 ml  Output 900 ml  Net -890 ml   Filed Weights   09/06/19 1552  Weight: 59.9 kg   Body mass index is 24.14 kg/m.   Examination: General: Alert awake and communicative.  On 7 L of oxygen.  Tachypneic. HENT: Normocephalic, pupils equally reacting to light and accommodation.  No scleral pallor or icterus noted. Oral mucosa is moist.  Chest:   Diminished breath sounds bilaterally.   Coarse breath sounds noted.  Tachypneic. CVS: S1 &S2 heard. No murmur.  Regular rate and rhythm. Abdomen: Soft, nontender, nondistended.  Bowel sounds are heard.  Extremities: No cyanosis, clubbing or edema.  Peripheral pulses are palpable. Psych: Alert, awake and oriented, slow to respond. CNS:  No cranial nerve deficits.  Power equal in all extremities.   Skin: Warm and dry.  No rashes noted.  Data Reviewed: I have personally reviewed following labs and imaging studies  CBC: Recent Labs  Lab 09/08/19 1230 09/08/19 1230 09/09/19 0256 09/10/19 0610 09/11/19 0655 09/12/19 0211 09/13/19 1056  WBC 18.0*   < > 9.5 19.5* 16.9* 12.1* 15.0*  NEUTROABS 14.7*  --   --   --  15.4*  --   --   HGB 10.1*   < > 9.9* 9.7* 9.3* 9.6* 10.6*  HCT 31.6*   < > 32.0* 30.8* 29.5* 30.1* 32.6*  MCV 84.7   < > 85.8 86.0 85.5 83.1 82.5  PLT 356   < > 358 401* 401* 412* 480*   < > = values in this interval not displayed.   Basic Metabolic Panel: Recent Labs  Lab 09/09/19 0256 09/10/19 0610 09/11/19 0655 09/12/19 0211 09/13/19 1056    NA 138 142 137 135 137  K 3.7 3.7 3.9 3.1* 3.8  CL 105 110 106 102 104  CO2 24 23 22 22  21*  GLUCOSE 164* 116* 110* 98 107*  BUN 5* <5* 8 8 6   CREATININE 0.52 0.60 0.57 0.61 0.64  CALCIUM 8.3* 8.4* 8.1* 8.2* 8.8*  MG  --  2.1 2.1 1.9 2.0  PHOS  --   --   --  2.3*  --    GFR: Estimated Creatinine Clearance: 68 mL/min (by C-G formula based on SCr of 0.64 mg/dL). Liver Function Tests: Recent Labs  Lab 09/08/19 1230 09/09/19 0256 09/10/19 0610 09/11/19 0655 09/12/19 0211  AST 51* 34 83* 78* 66*  ALT 13 14 48* 85* 88*  ALKPHOS 50 48 51 56 61  BILITOT 0.5 0.1* 0.2* 0.4 0.3  PROT 6.8 6.5 6.7 6.4* 6.2*  ALBUMIN 2.3* 2.1* 2.1* 2.1* 2.1*   No results for input(s): LIPASE, AMYLASE in the last 168 hours. No  results for input(s): AMMONIA in the last 168 hours. Coagulation Profile: No results for input(s): INR, PROTIME in the last 168 hours. Cardiac Enzymes: No results for input(s): CKTOTAL, CKMB, CKMBINDEX, TROPONINI in the last 168 hours. BNP (last 3 results) No results for input(s): PROBNP in the last 8760 hours. HbA1C: No results for input(s): HGBA1C in the last 72 hours. CBG: No results for input(s): GLUCAP in the last 168 hours. Lipid Profile: No results for input(s): CHOL, HDL, LDLCALC, TRIG, CHOLHDL, LDLDIRECT in the last 72 hours. Thyroid Function Tests: No results for input(s): TSH, T4TOTAL, FREET4, T3FREE, THYROIDAB in the last 72 hours. Anemia Panel: No results for input(s): VITAMINB12, FOLATE, FERRITIN, TIBC, IRON, RETICCTPCT in the last 72 hours. Sepsis Labs: Recent Labs  Lab 09/08/19 1230  PROCALCITON 0.44    Recent Results (from the past 240 hour(s))  Novel Coronavirus, NAA ([facility])     Status: None   Collection Time: 09/04/19  9:15 AM   Specimen: Nasopharyngeal(NP) swabs in vial transport medium   NASOPHARYNGE  TESTING  Result Value Ref Range Status   SARS-CoV-2, NAA Not Detected Not Detected Final    Comment: This nucleic acid amplification test was  developed and its performance characteristics determined by [employer] and [employer]. Nucleic acid amplification tests include PCR and TMA. This test has not been FDA cleared or approved. This test has been authorized by FDA under an Emergency Use Authorization (EUA). This test is only authorized for the duration of time the declaration that circumstances exist justifying the authorization of the emergency use of in vitro diagnostic tests for detection of SARS-CoV-2 virus and/or diagnosis of COVID-19 infection under section 564(b)(1) of the Act, 21 U.S.C. [id number]) (1), unless the authorization is terminated or revoked sooner. When diagnostic testing is negative, the possibility of a false negative result should be considered in the context of a patient's recent exposures and the presence of clinical signs and symptoms consistent with COVID-19. An individual without symptoms of COVID-19 and who is not shedding SARS-CoV-2 virus would  expect to have a negative (not detected) result in this assay.   SARS CORONAVIRUS 2 (TAT 6-24 HRS) Nasopharyngeal Nasopharyngeal Swab     Status: None   Collection Time: 09/07/19 12:07 AM   Specimen: Nasopharyngeal Swab  Result Value Ref Range Status   SARS Coronavirus 2 NEGATIVE NEGATIVE Final    Comment: (NOTE) SARS-CoV-2 target nucleic acids are NOT DETECTED. The SARS-CoV-2 RNA is generally detectable in upper and lower respiratory specimens during the acute phase of infection. Negative results do not preclude SARS-CoV-2 infection, do not rule out co-infections with other pathogens, and should not be used as the sole basis for treatment or other patient management decisions. Negative results must be combined with clinical observations, patient history, and epidemiological information. The expected result is Negative. Fact Sheet for Patients: SugarRoll.be Fact Sheet for Healthcare  Providers: https://www.woods-mathews.com/ This test is not yet approved or cleared by the [place] FDA and  has been authorized for detection and/or diagnosis of SARS-CoV-2 by FDA under an Emergency Use Authorization (EUA). This EUA will remain  in effect (meaning this test can be used) for the duration of the COVID-19 declaration under Section 56 4(b)(1) of the Act, 21 U.S.C. section 360bbb-3(b)(1), unless the authorization is terminated or revoked sooner. Performed at [facility], [place] [address], [place], [place] [address]   SARS CORONAVIRUS 2 (TAT 6-24 HRS) Nasopharyngeal Nasopharyngeal Swab     Status: None   Collection Time: 09/07/19  3:47  PM   Specimen: Nasopharyngeal Swab  Result Value Ref Range Status   SARS Coronavirus 2 NEGATIVE NEGATIVE Final    Comment: (NOTE) SARS-CoV-2 target nucleic acids are NOT DETECTED. The SARS-CoV-2 RNA is generally detectable in upper and lower respiratory specimens during the acute phase of infection. Negative results do not preclude SARS-CoV-2 infection, do not rule out co-infections with other pathogens, and should not be used as the sole basis for treatment or other patient management decisions. Negative results must be combined with clinical observations, patient history, and epidemiological information. The expected result is Negative. Fact Sheet for Patients: SugarRoll.be Fact Sheet for Healthcare Providers: https://www.woods-mathews.com/ This test is not yet approved or cleared by the [place] FDA and  has been authorized for detection and/or diagnosis of SARS-CoV-2 by FDA under an Emergency Use Authorization (EUA). This EUA will remain  in effect (meaning this test can be used) for the duration of the COVID-19 declaration under Section 56 4(b)(1) of the Act, 21 U.S.C. section 360bbb-3(b)(1), unless the authorization is terminated or revoked sooner. Performed at  [facility], [place] [address], [place], [place] [address]   Respiratory Panel by PCR     Status: None   Collection Time: 09/08/19 11:43 AM   Specimen: Nasopharyngeal Swab; Respiratory  Result Value Ref Range Status   Adenovirus NOT DETECTED NOT DETECTED Final   Coronavirus 229E NOT DETECTED NOT DETECTED Final    Comment: (NOTE) The Coronavirus on the Respiratory Panel, DOES NOT test for the novel  Coronavirus (2019 nCoV)    Coronavirus HKU1 NOT DETECTED NOT DETECTED Final   Coronavirus NL63 NOT DETECTED NOT DETECTED Final   Coronavirus OC43 NOT DETECTED NOT DETECTED Final   Metapneumovirus NOT DETECTED NOT DETECTED Final   Rhinovirus / Enterovirus NOT DETECTED NOT DETECTED Final   Influenza A NOT DETECTED NOT DETECTED Final   Influenza B NOT DETECTED NOT DETECTED Final   Parainfluenza Virus 1 NOT DETECTED NOT DETECTED Final   Parainfluenza Virus 2 NOT DETECTED NOT DETECTED Final   Parainfluenza Virus 3 NOT DETECTED NOT DETECTED Final   Parainfluenza Virus 4 NOT DETECTED NOT DETECTED Final   Respiratory Syncytial Virus NOT DETECTED NOT DETECTED Final   Bordetella pertussis NOT DETECTED NOT DETECTED Final   Chlamydophila pneumoniae NOT DETECTED NOT DETECTED Final   Mycoplasma pneumoniae NOT DETECTED NOT DETECTED Final    Comment: Performed at [facility], [place]. [address] [place], [place] [address]  MRSA PCR Screening     Status: None   Collection Time: 09/13/19  3:54 AM   Specimen: Nasopharyngeal  Result Value Ref Range Status   MRSA by PCR NEGATIVE NEGATIVE Final    Comment:        The GeneXpert MRSA Assay (FDA approved for NASAL specimens only), is one component of a comprehensive MRSA colonization surveillance program. It is not intended to diagnose MRSA infection nor to guide or monitor treatment for MRSA infections. Performed at [facility], [place] [address], [place], [place] [address]      Radiology Studies: No results found.   Scheduled  Meds: . bictegravir-emtricitabine-tenofovir AF  1 tablet Oral Daily  . enoxaparin (LOVENOX) injection  40 mg Subcutaneous Daily  . guaiFENesin  600 mg Oral BID  . methylPREDNISolone (SOLU-MEDROL) injection  40 mg Intravenous Daily  . pantoprazole  40 mg Oral Daily  . potassium & sodium phosphates  1 packet Oral TID WC & HS  . sodium chloride flush  10-40 mL  Intracatheter Q12H   Continuous Infusions: . sulfamethoxazole-trimethoprim 400 mg of trimethoprim (09/14/19 0540)     LOS: 8 days    Flora Lipps, MD [facility] 09/14/2019,

## 2019-09-14 NOTE — Progress Notes (Signed)
Occupational Therapy Evaluation Patient Details Name: Deborah Durham MRN: [id number] DOB: Jul 29, 1971 Today's Date: 09/14/2019    History of Present Illness 49 y.o. F admitted for 2-week history of SOB, fevers, and decreased appetite. 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 No significant PMH.   Clinical Impression   PTA, pt was living at home with her family, she reports her son was assisting her with ADL/IADL during the past couple of weeks and he was assisting her with functional mobility but not with transfers. Pt currently significantly limited by anxiety increasing SOB, HR, and RR. Pt reports her goals are to be able to walk to the door, sit in the recliner for mealtime and get control over her breath and anxiety. Pt in supported sitting in bed, during discussion of mobility, pt RR increased to 40 HR increased to 115 and pt appeared anxious. Utilized breathing/mindful video prior to mobility, RR 25-32, HR 98bpm SpO2 98% 7lnc. Pt progressed to EOB with minA to scoot hips. She tolerated sitting EOB for 15-59min focusing on her breath, when pt began engaging in activity HR increased, RR increased, and SpO2 decreased. Attempted sit<>stand x1, but declined due to increased anxiety. HR up to 123, RR 58-60, SpO2 83-87, pt reported dizziness. BP 109/97 with return to supine.  Due to decline in current level of function, pt would benefit from acute OT to address established goals to facilitate safe D/C to venue listed below. At this time, recommend CIR follow-up. Will continue to follow acutely.     Follow Up Recommendations  CIR    Equipment Recommendations  3 in 1 bedside commode    Recommendations for Other Services       Precautions / Restrictions Precautions Precautions: None Restrictions Weight Bearing Restrictions: No      Mobility Bed Mobility Overal bed mobility: Needs Assistance Bed Mobility: Supine to Sit;Sit to Supine     Supine to sit: Min guard;HOB  elevated Sit to supine: Min assist;HOB elevated   General bed mobility comments: min guard for safety but with increased time and effort; pt anxious worked on pursed lip breathing with mobility to ease anxiety HR up to 120, SpO2 88% 7lnc able to recover to 93%  Transfers Overall transfer level: Needs assistance Equipment used: Rolling walker (2 wheeled) Transfers: Sit to/from Stand Sit to Stand: Min assist         General transfer comment: minA to powerup into standing;pt declined attempting to stand this date;reported dizziness drop in BP     Balance Overall balance assessment: Needs assistance Sitting-balance support: Feet unsupported;Single extremity supported Sitting balance-Leahy Scale: Good Sitting balance - Comments: pt kept one hand on mattress because SOB, not due to balance, supervision       Standing balance comment: pt declined standing this session                           ADL either performed or assessed with clinical judgement   ADL Overall ADL's : Needs assistance/impaired Eating/Feeding: Set up;Sitting   Grooming: Set up;Sitting   Upper Body Bathing: Minimal assistance;Sitting   Lower Body Bathing: Maximal assistance;Sitting/lateral leans   Upper Body Dressing : Moderate assistance;Sitting   Lower Body Dressing: Maximal assistance     Toilet Transfer Details (indicate cue type and reason): deferred           General ADL Comments: attempted sit<>stand pt limited by anxiety with unstable vitals RR up to 60,  HR up to 123bpm SpO2 to 88% 7lnc     Vision Patient Visual Report: No change from baseline Vision Assessment?: No apparent visual deficits     Perception     Praxis      Pertinent Vitals/Pain Pain Assessment: No/denies pain     Hand Dominance Right   Extremity/Trunk Assessment Upper Extremity Assessment Upper Extremity Assessment: Overall WFL for tasks assessed   Lower Extremity Assessment Lower Extremity Assessment:  Generalized weakness   Cervical / Trunk Assessment Cervical / Trunk Assessment: Normal   Communication Communication Communication: No difficulties   Cognition Arousal/Alertness: Awake/alert Behavior During Therapy: Anxious Overall Cognitive Status: Within Functional Limits for tasks assessed                                 General Comments: worked on [employer] to address anxiety, pt reports previous use of these strategies and positive benefits;use of videos prior to movement and while sitting EOB allowed pt to have more control over breath   General Comments  pt limited by anxiety     Exercises     Shoulder Instructions      Home Living Family/patient expects to be discharged to:: Private residence Living Arrangements: Children;Spouse/significant other;Parent Available Help at Discharge: Family Type of Home: House Home Access: Stairs to enter Technical brewer of Steps: 2 Entrance Stairs-Rails: None Home Layout: One level     Bathroom Shower/Tub: Teacher, early years/pre: Standard Bathroom Accessibility: Yes   Home Equipment: Grab bars - tub/shower          Prior Functioning/Environment Level of Independence: Needs assistance  Gait / Transfers Assistance Needed: she states that in past couple of weeks, son helped her walk around the house due to SOB; no assistance with transfers ADL's / Homemaking Assistance Needed: son helps with getting dressed/bathed, etc.            OT Problem List: Decreased activity tolerance;Impaired balance (sitting and/or standing);Decreased safety awareness;Decreased knowledge of precautions      OT Treatment/Interventions: Self-care/ADL training;Therapeutic exercise;Energy conservation;DME and/or AE instruction;Therapeutic activities;Patient/family education;Balance training    OT Goals(Current goals can be found in the care plan section) Acute Rehab OT Goals Patient Stated Goal: to  gain control of breathing/anxiety;be able to sit in recliner for meals;walk to the door OT Goal Formulation: With patient Time For Goal Achievement: 09/28/19 Potential to Achieve Goals: Good ADL Goals Pt Will Perform Grooming: with set-up;sitting Pt Will Perform Lower Body Dressing: with min guard assist;sit to/from stand Pt Will Transfer to Toilet: with min guard assist;ambulating Additional ADL Goal #1: Pt will tolerate sitting in recliner for mealtime with stable vitals. Additional ADL Goal #2: Pt demonstrate independence with 3 energy conservation strategies during ADL completion.  OT Frequency: Min 2X/week   Barriers to D/C: Decreased caregiver support          Co-evaluation              AM-PAC OT "6 Clicks" Daily Activity     Outcome Measure Help from another person eating meals?: A Little Help from another person taking care of personal grooming?: A Little Help from another person toileting, which includes using toliet, bedpan, or urinal?: A Lot Help from another person bathing (including washing, rinsing, drying)?: A Lot Help from another person to put on and taking off regular upper body clothing?: A Little Help from another person to put on and taking off  regular lower body clothing?: A Lot 6 Click Score: 15   End of Session Equipment Utilized During Treatment: Gait belt;Rolling walker Nurse Communication: Mobility status  Activity Tolerance: Patient tolerated treatment well(limited by anxiety with mobility) Patient left: in bed;with bed alarm set;with call bell/phone within reach  OT Visit Diagnosis: Unsteadiness on feet (R26.81);Other abnormalities of gait and mobility (R26.89);Muscle weakness (generalized) (M62.81)                Time: [id number] OT Time Calculation (min): 43 min Charges:  OT General Charges $OT Visit: 1 Visit OT Evaluation $OT Eval Moderate Complexity: 1 Mod OT Treatments $Self Care/Home Management : 23-37 mins  Dorinda Hill  OTR/L Acute Rehabilitation Services Office: [place] 09/14/2019, 11:24 AM

## 2019-09-15 LAB — CBC
HCT: 31.1 % — ABNORMAL LOW (ref 36.0–46.0)
Hemoglobin: 10.2 g/dL — ABNORMAL LOW (ref 12.0–15.0)
MCH: 27.2 pg (ref 26.0–34.0)
MCHC: 32.8 g/dL (ref 30.0–36.0)
MCV: 82.9 fL (ref 80.0–100.0)
Platelets: 465 10*3/uL — ABNORMAL HIGH (ref 150–400)
RBC: 3.75 MIL/uL — ABNORMAL LOW (ref 3.87–5.11)
RDW: 15.2 % (ref 11.5–15.5)
WBC: 10.5 10*3/uL (ref 4.0–10.5)
nRBC: 0 % (ref 0.0–0.2)

## 2019-09-15 LAB — COMPREHENSIVE METABOLIC PANEL
ALT: 52 U/L — ABNORMAL HIGH (ref 0–44)
AST: 28 U/L (ref 15–41)
Albumin: 2.4 g/dL — ABNORMAL LOW (ref 3.5–5.0)
Alkaline Phosphatase: 54 U/L (ref 38–126)
Anion gap: 13 (ref 5–15)
BUN: 8 mg/dL (ref 6–20)
CO2: 24 mmol/L (ref 22–32)
Calcium: 9 mg/dL (ref 8.9–10.3)
Chloride: 98 mmol/L (ref 98–111)
Creatinine, Ser: 0.57 mg/dL (ref 0.44–1.00)
GFR calc Af Amer: >60 mL/min (ref >60)
GFR calc non Af Amer: >60 mL/min (ref >60)
Glucose, Bld: 78 mg/dL (ref 70–99)
Potassium: 4.5 mmol/L (ref 3.5–5.1)
Sodium: 135 mmol/L (ref 135–145)
Total Bilirubin: 0.5 mg/dL (ref 0.3–1.2)
Total Protein: 6.8 g/dL (ref 6.5–8.1)

## 2019-09-15 LAB — PHOSPHORUS: Phosphorus: 4 mg/dL (ref 2.5–4.6)

## 2019-09-15 LAB — MAGNESIUM: Magnesium: 2 mg/dL (ref 1.7–2.4)

## 2019-09-15 MED ORDER — ALUM & MAG HYDROXIDE-SIMETH 200-200-20 MG/5ML PO SUSP
30.0000 mL | Freq: Four times a day (QID) | ORAL | Status: DC | PRN
  Filled 2019-09-15: qty 30

## 2019-09-15 MED ORDER — POLYETHYLENE GLYCOL 3350 17 G PO PACK
17.0000 g | PACK | Freq: Every day | ORAL | Status: DC
  Administered 2019-09-15 – 2019-09-20 (×4): 17 g via ORAL
  Filled 2019-09-15 (×7): qty 1

## 2019-09-15 MED ORDER — SUCRALFATE 1 GM/10ML PO SUSP
1.0000 g | Freq: Three times a day (TID) | ORAL | Status: DC
  Administered 2019-09-15 – 2019-09-20 (×15): 1 g via ORAL
  Filled 2019-09-15 (×17): qty 10

## 2019-09-15 NOTE — Progress Notes (Signed)
Daily progress note Patient tolerated bath today, sitting by the sink with oxygen. Patient tolerated sitting up in chair for one hour with O2 sat remaining between 94-97% on 4L. Patient states that " the anitbiotic is making her sick" Patient states she feels nausea and a metallic taste when receiving anitbiotic".

## 2019-09-15 NOTE — Progress Notes (Signed)
Notified Provider on call that pt was refusing Bactrim IVPB d/t frequent episodes of n/v, Zofran IVP was given at 2041 for episode of emesis. Will cont to monitor.

## 2019-09-15 NOTE — Progress Notes (Signed)
PROGRESS NOTE    Deborah Durham  [id number] DOB: March 22, 1971 DOA: 09/06/2019 PCP: Patient, No Pcp Per   Brief Narrative:  As per HPI,   Patient is a 49 year old female with no significant past medical history presented to the hospital with  2-week history of shortness of breath, subjective fevers and decreased appetite with dyspnea on exertion with multiple negative Covid tests prior to admission and on admission.  Negative respiratory panel.  Patient was found to have positive HIV antigen and multifocal pneumonia on CT scan.  She was Started on empiric treatment for pneumocystis pneumonia by pulmonology with Solu-Medrol 40 mg IV twice daily as well as Bactrim and continued on empiric CAP therapy with Rocephin and azithromycin.  ID consulted for HIV eval.  On1/11: Per ID, HIV 1 antibody positive and low CD4 count confirms advanced HIV which has been complicated by suspected pneumocystis pneumonia.  Bactrim and steroids continued, started on Biktarvy. CAP therapy discontinued.  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  On 4.5 L  of nasal cannula at this time.  Repeat chest x-ray from 09/14/2019 showed persistent multilobar bilateral pneumonia.  Off IV fluids.  Not on antibiotics.  Continue IV Solu-Medrol and IV Bactrim. Dose of solumedrol has been decreased due to hallucinations and hallucinations have resolved.  ID  on board.  Leukocytosis has resolved despite steroids.  Procalcitonin at 0.4 on 09/08/2019.  Continue incentive spirometry, flutter valve. Wean oxygen as able but still  on significant amount of oxygen.  Urinary Legionella antigen was negative.    Newly diagnosed HIV Absolute CD4 count of less than 35 on 09/09/2019.  Patient currently on IV steroids and IV Bactrim and started on Biktarvy per ID.  ID on board. Indeterminate QuantiFERON test. ANA negative. Mildly elevated rheumatoid factor.  Trace pericardial effusion Recommend outpatient follow-up.  Elevated LFT. Could be secondary to HIV.  Hepatitis C antibody nonreactive.  Will follow CMP periodically. Improved.  Nausea and vomiting, possible gastritits. With some nausea vomiting.  Has not had a bowel movement in 3 days..  On IV Bactrim.  IV PPI.  As per ID, unlikely from antivirals and Bactrim.   On reduced IV solumedrol dose.  Add Maalox sucralfate and MiraLAX.  Mild hypokalemia. Improved.  Magnesium of 2.0.  Latest potassium of 4.5.  Mild hypophosphatemia. On oral potassium phosphate. Improved, will discontinue tablets.  Latest phosphorus of 4.0.  Mild hyponatremia.  Sodium of 130.  Check BMP in a.m.  Anxiety secondary to acute illness.  Ativan PRN   DVT prophylaxis: Lovenox subcu  Code Status: Full code  Family Communication:  None.  Patient states that she is communicating with her family.  Disposition Plan:   PT recommending CIR, Pending clinical improvement.  Patient is still hypoxic and symptomatic.  Follow ID recommendations.  Consultants:   Infectious disease: Dr. Megan Salon 09/08/2019  PCCM: Dr. Shearon Stalls 09/08/2019  Procedures:   2D echo 09/08/2019  Chest x-ray 09/06/2019, 09/08/2019  CT angiogram chest 09/06/2019  Antimicrobials:   Biktarvy 09/09/2019  Oral Bactrim 09/08/2019>>> 09/09/2019  IV azithromycin 09/06/2019>>>>> 09/09/2019  IV Rocephin 09/06/2019>> 09/09/2019  IV Bactrim 09/09/2019  Subjective: Today, patient still complains of mild nausea and had few episodes of vomiting yesterday.  Has not had a bowel movement in 3 days.  Objective: Vitals:   09/14/19 2002 09/14/19  2350 09/15/19 0300 09/15/19 0538  BP: 122/90 106/71 116/88   Pulse: 93 97 98 83  Resp: (!) 24 (!) 24 (!) 30 20  Temp: 98 F (36.7 C) 98 F (36.7 C) 97.9 F (36.6 C)   TempSrc: Axillary Oral Oral   SpO2: 94% 99% 99% 99%  Weight:      Height:        Intake/Output Summary (Last 24 hours) at 09/15/2019 0743 Last data filed at 09/14/2019 2257 Gross per 24 hour  Intake 10 ml  Output -  Net 10 ml   Filed Weights   09/06/19 1552  Weight: 59.9 kg   Body mass index is 24.14 kg/m.   Examination: General: Alert awake and communicative.  On nasal cannula 4.5 L/min.  Appears weak and deconditioned HENT: Normocephalic, pupils equally reacting to light and accommodation.  No scleral pallor or icterus noted. Oral mucosa is moist.  Chest:   Diminished breath sounds bilaterally.   Coarse breath sounds noted.  Tachypneic. CVS: S1 &S2 heard. No murmur.  Regular rate and rhythm. Abdomen: Soft, nontender, nondistended.  Bowel sounds are heard.  Extremities: No cyanosis, clubbing or edema.  Peripheral pulses are palpable. Psych: Alert, awake and oriented, slow to respond. CNS:  No cranial nerve deficits.  Power equal in all extremities.   Skin: Warm and dry.  No rashes noted.  Data Reviewed: I have personally reviewed following labs and imaging studies  CBC: Recent Labs  Lab 09/08/19 1230 09/09/19 0256 09/11/19 0655 09/12/19 0211 09/13/19 1056 09/14/19 1734 09/15/19 0548  WBC 18.0*   < > 16.9* 12.1* 15.0* 12.9* 10.5  NEUTROABS 14.7*  --  15.4*  --   --   --   --   HGB 10.1*   < > 9.3* 9.6* 10.6* 10.2* 10.2*  HCT 31.6*   < > 29.5* 30.1* 32.6* 30.9* 31.1*  MCV 84.7   < > 85.5 83.1 82.5 83.5 82.9  PLT 356   < > 401* 412* 480* 466* 465*   < > = values in this interval not displayed.   Basic Metabolic Panel: Recent Labs  Lab 09/11/19 0655 09/12/19 0211 09/13/19 1056 09/14/19 1734 09/15/19 0548  NA 137 135 137 130* 135  K 3.9 3.1* 3.8 4.3 4.5  CL 106 102 104 96* 98  CO2 22 22  21* 20* 24  GLUCOSE 110* 98 107* 179* 78  BUN 8 8 6 8 8   CREATININE 0.57 0.61 0.64 0.69 0.57  CALCIUM 8.1* 8.2* 8.8* 8.8* 9.0  MG 2.1 1.9 2.0 1.9 2.0  PHOS  --  2.3*  --  3.4 4.0   GFR: Estimated Creatinine Clearance: 68 mL/min (by C-G formula based on SCr of 0.57 mg/dL). Liver Function Tests: Recent Labs  Lab 09/10/19 0610 09/11/19 0655 09/12/19 0211 09/14/19 1734 09/15/19 0548  AST 83* 78* 66* 38 28  ALT 48* 85* 88* 63* 52*  ALKPHOS 51 56 61 60 54  BILITOT 0.2* 0.4 0.3 0.2* 0.5  PROT 6.7 6.4* 6.2* 7.1 6.8  ALBUMIN 2.1* 2.1* 2.1* 2.4* 2.4*   No results for input(s): LIPASE, AMYLASE in the last 168 hours. No results for input(s): AMMONIA in the last 168 hours. Coagulation Profile: No results for input(s): INR, PROTIME in the last 168 hours.  Cardiac Enzymes: No results for input(s): CKTOTAL, CKMB, CKMBINDEX, TROPONINI in the last 168 hours. BNP (last 3 results) No results for input(s): PROBNP in the last 8760 hours. HbA1C: No results for input(s): HGBA1C in the last 72 hours. CBG: No results for input(s): GLUCAP in the last 168 hours. Lipid Profile: No results for input(s): CHOL, HDL, LDLCALC, TRIG, CHOLHDL, LDLDIRECT in the last 72 hours. Thyroid Function Tests: No results for input(s): TSH, T4TOTAL, FREET4, T3FREE, THYROIDAB in the last 72 hours. Anemia Panel: No results for input(s): VITAMINB12, FOLATE, FERRITIN, TIBC, IRON, RETICCTPCT in the last 72 hours. Sepsis Labs: Recent Labs  Lab 09/08/19 1230  PROCALCITON 0.44    Recent Results (from the past 240 hour(s))  SARS CORONAVIRUS 2 (TAT 6-24 HRS) Nasopharyngeal Nasopharyngeal Swab     Status: None   Collection Time: 09/07/19 12:07 AM   Specimen: Nasopharyngeal Swab  Result Value Ref Range Status   SARS Coronavirus 2 NEGATIVE NEGATIVE Final    Comment: (NOTE) SARS-CoV-2 target nucleic acids are NOT DETECTED. The SARS-CoV-2 RNA is generally detectable in upper and lower respiratory specimens during the  acute phase of infection. Negative results do not preclude SARS-CoV-2 infection, do not rule out co-infections with other pathogens, and should not be used as the sole basis for treatment or other patient management decisions. Negative results must be combined with clinical observations, patient history, and epidemiological information. The expected result is Negative. Fact Sheet for Patients: SugarRoll.be Fact Sheet for Healthcare Providers: https://www.woods-mathews.com/ This test is not yet approved or cleared by the [place] FDA and  has been authorized for detection and/or diagnosis of SARS-CoV-2 by FDA under an Emergency Use Authorization (EUA). This EUA will remain  in effect (meaning this test can be used) for the duration of the COVID-19 declaration under Section 56 4(b)(1) of the Act, 21 U.S.C. section 360bbb-3(b)(1), unless the authorization is terminated or revoked sooner. Performed at [facility], [place] [address], [place], [place] [address]   SARS CORONAVIRUS 2 (TAT 6-24 HRS) Nasopharyngeal Nasopharyngeal Swab     Status: None   Collection Time: 09/07/19  3:47 PM   Specimen: Nasopharyngeal Swab  Result Value Ref Range Status   SARS Coronavirus 2 NEGATIVE NEGATIVE Final    Comment: (NOTE) SARS-CoV-2 target nucleic acids are NOT DETECTED. The SARS-CoV-2 RNA is generally detectable in upper and lower respiratory specimens during the acute phase of infection. Negative results do not preclude SARS-CoV-2 infection, do not rule out co-infections with other pathogens, and should not be used as the sole basis for treatment or other patient management decisions. Negative results must be combined with clinical observations, patient history, and epidemiological information. The expected result is Negative. Fact Sheet for Patients: SugarRoll.be Fact Sheet for Healthcare Providers:  https://www.woods-mathews.com/ This test is not yet approved or cleared by the [place] FDA and  has been authorized for detection and/or diagnosis of SARS-CoV-2 by FDA under an Emergency Use Authorization (EUA). This EUA will remain  in effect (meaning this test can be used) for the duration of the COVID-19 declaration under Section 56 4(b)(1) of the Act, 21 U.S.C. section 360bbb-3(b)(1), unless the authorization is terminated or revoked sooner. Performed at [facility], [place] [address] [place], [place] [address]   Respiratory Panel by PCR     Status: None   Collection Time: 09/08/19 11:43 AM   Specimen: Nasopharyngeal Swab; Respiratory  Result Value Ref Range Status   Adenovirus NOT DETECTED NOT DETECTED Final  Coronavirus 229E NOT DETECTED NOT DETECTED Final    Comment: (NOTE) The Coronavirus on the Respiratory Panel, DOES NOT test for the novel  Coronavirus (2019 nCoV)    Coronavirus HKU1 NOT DETECTED NOT DETECTED Final   Coronavirus NL63 NOT DETECTED NOT DETECTED Final   Coronavirus OC43 NOT DETECTED NOT DETECTED Final   Metapneumovirus NOT DETECTED NOT DETECTED Final   Rhinovirus / Enterovirus NOT DETECTED NOT DETECTED Final   Influenza A NOT DETECTED NOT DETECTED Final   Influenza B NOT DETECTED NOT DETECTED Final   Parainfluenza Virus 1 NOT DETECTED NOT DETECTED Final   Parainfluenza Virus 2 NOT DETECTED NOT DETECTED Final   Parainfluenza Virus 3 NOT DETECTED NOT DETECTED Final   Parainfluenza Virus 4 NOT DETECTED NOT DETECTED Final   Respiratory Syncytial Virus NOT DETECTED NOT DETECTED Final   Bordetella pertussis NOT DETECTED NOT DETECTED Final   Chlamydophila pneumoniae NOT DETECTED NOT DETECTED Final   Mycoplasma pneumoniae NOT DETECTED NOT DETECTED Final    Comment: Performed at [facility], [place] [address], [place], [place] [address]  MRSA PCR Screening     Status: None   Collection Time: 09/13/19  3:54 AM   Specimen: Nasopharyngeal   Result Value Ref Range Status   MRSA by PCR NEGATIVE NEGATIVE Final    Comment:        The GeneXpert MRSA Assay (FDA approved for NASAL specimens only), is one component of a comprehensive MRSA colonization surveillance program. It is not intended to diagnose MRSA infection nor to guide or monitor treatment for MRSA infections. Performed at [facility], [place] [address], [place], [place] [address]      Radiology Studies: DG CHEST PORT 1 VIEW  Result Date: 09/14/2019 CLINICAL DATA:  49 year old female with history of hypoxia. EXAM: PORTABLE CHEST 1 VIEW COMPARISON:  Chest x-ray 09/08/2019. FINDINGS: Patchy multifocal ill-defined opacities noted throughout the lungs bilaterally, similar to the recent prior study, compatible with multilobar bilateral pneumonia. Overall, aeration appears unchanged. No pleural effusions. No evidence of pulmonary edema. Heart size is normal. Upper mediastinal contours are within normal limits. IMPRESSION: 1. Persistent multilobar bilateral pneumonia, as above. Given the multiple recent negative COVID-19 test, and a recent diagnosis of HIV, findings are presumably reflective of alternative atypical infectious etiology such as pneumocystis jiroveci pneumonia. Electronically Signed   By: Vinnie Langton M.D.   On: 09/14/2019 10:39     Scheduled Meds: . bictegravir-emtricitabine-tenofovir AF  1 tablet Oral Daily  . enoxaparin (LOVENOX) injection  40 mg Subcutaneous Daily  . guaiFENesin  600 mg Oral BID  . methylPREDNISolone (SOLU-MEDROL) injection  40 mg Intravenous Daily  . pantoprazole (PROTONIX) IV  40 mg Intravenous Q24H  . phosphorus  500 mg Oral TID WC & HS  . polyethylene glycol  17 g Oral Daily  . sodium chloride flush  10-40 mL Intracatheter Q12H  . sucralfate  1 g Oral TID WC & HS   Continuous Infusions: . sulfamethoxazole-trimethoprim 400 mg of trimethoprim (09/15/19 0641)     LOS: 9 days    Flora Lipps, MD [facility]  09/15/2019,

## 2019-09-16 DIAGNOSIS — R112 Nausea with vomiting, unspecified: Secondary | ICD-10-CM

## 2019-09-16 LAB — COMPREHENSIVE METABOLIC PANEL
ALT: 48 U/L — ABNORMAL HIGH (ref 0–44)
AST: 25 U/L (ref 15–41)
Albumin: 2.5 g/dL — ABNORMAL LOW (ref 3.5–5.0)
Alkaline Phosphatase: 53 U/L (ref 38–126)
Anion gap: 9 (ref 5–15)
BUN: 9 mg/dL (ref 6–20)
CO2: 25 mmol/L (ref 22–32)
Calcium: 9 mg/dL (ref 8.9–10.3)
Chloride: 100 mmol/L (ref 98–111)
Creatinine, Ser: 0.68 mg/dL (ref 0.44–1.00)
GFR calc Af Amer: >60 mL/min (ref >60)
GFR calc non Af Amer: >60 mL/min (ref >60)
Glucose, Bld: 79 mg/dL (ref 70–99)
Potassium: 4.3 mmol/L (ref 3.5–5.1)
Sodium: 134 mmol/L — ABNORMAL LOW (ref 135–145)
Total Bilirubin: 0.4 mg/dL (ref 0.3–1.2)
Total Protein: 6.6 g/dL (ref 6.5–8.1)

## 2019-09-16 LAB — CBC
HCT: 30.1 % — ABNORMAL LOW (ref 36.0–46.0)
Hemoglobin: 9.9 g/dL — ABNORMAL LOW (ref 12.0–15.0)
MCH: 27.4 pg (ref 26.0–34.0)
MCHC: 32.9 g/dL (ref 30.0–36.0)
MCV: 83.4 fL (ref 80.0–100.0)
Platelets: 462 10*3/uL — ABNORMAL HIGH (ref 150–400)
RBC: 3.61 MIL/uL — ABNORMAL LOW (ref 3.87–5.11)
RDW: 15 % (ref 11.5–15.5)
WBC: 8.2 10*3/uL (ref 4.0–10.5)
nRBC: 0 % (ref 0.0–0.2)

## 2019-09-16 LAB — PHOSPHORUS: Phosphorus: 4.2 mg/dL (ref 2.5–4.6)

## 2019-09-16 LAB — MAGNESIUM: Magnesium: 2 mg/dL (ref 1.7–2.4)

## 2019-09-16 LAB — HLA B*5701: HLA B 5701: NEGATIVE

## 2019-09-16 MED ORDER — SULFAMETHOXAZOLE-TRIMETHOPRIM 800-160 MG PO TABS: 2.0000 | ORAL_TABLET | Freq: Three times a day (TID) | ORAL | Status: DC

## 2019-09-16 MED ORDER — PREDNISONE 20 MG PO TABS
20.0000 mg | ORAL_TABLET | Freq: Every day | ORAL | Status: DC
  Administered 2019-09-16: 20 mg via ORAL
  Filled 2019-09-16 (×2): qty 1

## 2019-09-16 MED ORDER — SODIUM CHLORIDE 0.9 % IV SOLN: INTRAVENOUS | Status: AC

## 2019-09-16 MED ORDER — PROMETHAZINE HCL 25 MG RE SUPP
25.0000 mg | Freq: Four times a day (QID) | RECTAL | Status: DC | PRN
  Filled 2019-09-16: qty 1

## 2019-09-16 NOTE — Progress Notes (Addendum)
PROGRESS NOTE    Deborah Durham  [id number] DOB: 1970/11/18 DOA: 09/06/2019 PCP: Patient, No Pcp Per   Brief Narrative:  As per HPI,   Patient is a 49 year old female with no significant past medical history presented to the hospital with  2-week history of shortness of breath, subjective fevers and decreased appetite with dyspnea on exertion with multiple negative Covid tests prior to admission and on admission.  Negative respiratory panel.  Patient was found to have positive HIV antigen and multifocal pneumonia on CT scan.  She was Started on empiric treatment for pneumocystis pneumonia by pulmonology with Solu-Medrol 40 mg IV twice daily as well as Bactrim and continued on empiric CAP therapy with Rocephin and azithromycin.  ID consulted for HIV eval.  On1/11: Per ID, HIV 1 antibody positive and low CD4 count confirms advanced HIV which has been complicated by suspected pneumocystis pneumonia.  Bactrim and steroids continued, started on Biktarvy. CAP therapy discontinued.  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  On 3.5 L  of nasal cannula at this time.  Repeat chest x-ray from 09/14/2019 showed persistent multilobar bilateral pneumonia.    Not on antibiotics.  Continue IV Solu-Medrol and IV Bactrim. Dose of solumedrol has been decreased due to hallucinations and hallucinations have resolved.  ID  on board.  Leukocytosis has resolved despite steroids.  Procalcitonin at 0.4 on 09/08/2019.  Continue incentive spirometry, flutter valve. Wean oxygen as able.  Urinary Legionella  antigen was negative.    Newly diagnosed HIV Absolute CD4 count of less than 35 on 09/09/2019.  Patient currently on IV steroids and IV Bactrim and started on Biktarvy per ID.  ID on board. Indeterminate QuantiFERON test, ANA negative. Mildly elevated rheumatoid factor.  Trace pericardial effusion Recommend outpatient follow-up.  Elevated LFT. Could be secondary to HIV.  Hepatitis C antibody nonreactive.  Improved.  Nausea and vomiting, possible gastritits. Complains of persistent nausea vomiting.  6-7 times yesterday.  On IV Bactrim.  ID is planning to change to p.o.  Solu-Medrol was changed to p.o. prednisone.  Added Maalox, sucralfate and MiraLAX yesterday.  Add Phenergan suppository for intractable nausea.  Start IV fluid today at for 1 day due to nausea vomiting.  Patient stated that he has a history of gastritis and ulcer in the past has had EGD in the past.  Mild hypokalemia. Improved.  Magnesium of 2.0.  Latest potassium of 4.3.  Mild hypophosphatemia.improved with replacement.  Latest phosphorus of 4.2.  Mild hyponatremia.  Sodium of 134 from 130.  monitor BMP in a.m.  Anxiety secondary to acute illness.  Ativan PRN   DVT prophylaxis: Lovenox subcu  Code Status: Full code  Family Communication:  None.  Patient states that she is communicating with her family.  Disposition Plan: CIR pending evaluation,, pending clinical improvement with nausea and vomiting..  .  Consultants:   Infectious disease: Dr. Megan Salon 09/08/2019  PCCM: Dr. Shearon Stalls 09/08/2019  Procedures:   2D echo 09/08/2019  Chest x-ray 09/06/2019, 09/08/2019  CT angiogram chest 09/06/2019  Antimicrobials:   Biktarvy 09/09/2019  Oral Bactrim 09/08/2019>>> 09/09/2019  IV  azithromycin 09/06/2019>>>>> 09/09/2019  IV Rocephin 09/06/2019>> 09/09/2019  IV Bactrim 09/09/2019  Subjective: Today, still complains of multiple episodes of vomiting 6-7 times.  She feels like Bactrim and Solu-Medrol is continued.  Has not had  a bowel movement in 3 days.  Objective: Vitals:   09/15/19 1727 09/15/19 2130 09/15/19 2300 09/16/19 0426  BP: 111/87 (!) 123/91 110/88 (!) 126/97  Pulse: 79 83 78 96  Resp: 18 18 18 18   Temp: 98 F (36.7 C) 97.6 F (36.4 C) 97.8 F (36.6 C) 98.3 F (36.8 C)  TempSrc: Oral Oral Oral Oral  SpO2: 98% 100% 100% 100%  Weight:      Height:        Intake/Output Summary (Last 24 hours) at 09/16/2019 0741 Last data filed at 09/15/2019 2241 Gross per 24 hour  Intake 190 ml  Output 1025 ml  Net -835 ml   Filed Weights   09/06/19 1552  Weight: 59.9 kg   Body mass index is 24.14 kg/m.   Examination: General: Alert awake and communicative. On nasal cannula 3.5 L/ minute.  Weak and deconditioned. HENT: Normocephalic, pupils equally reacting to light and accommodation.  No scleral pallor or icterus noted. Oral mucosa is moist.  Chest:  Diminished breath sounds bilaterally.  Coarse breath sounds noted. CVS: S1 &S2 heard. No murmur.  Regular rate and rhythm. Abdomen: Soft, nontender, nondistended.  Bowel sounds are heard.  Liver is not palpable, no abdominal mass palpated Extremities: No cyanosis, clubbing or edema.  Peripheral pulses are palpable. Psych: Alert, awake and oriented, normal mood.  Slow to respond CNS:  No cranial nerve deficits.  Power equal in all extremities.  No sensory deficits noted.  No cerebellar signs.   Skin: Warm and dry.  No rashes noted.   Data Reviewed: I have personally reviewed following labs and imaging studies  CBC: Recent Labs  Lab 09/11/19 0655 09/11/19 0655 09/12/19 0211 09/13/19 1056 09/14/19 1734 09/15/19 0548 09/16/19 0248  WBC 16.9*   < > 12.1* 15.0* 12.9* 10.5 8.2  NEUTROABS 15.4*  --   --   --   --   --   --   HGB 9.3*   < > 9.6* 10.6* 10.2* 10.2* 9.9*  HCT 29.5*   < > 30.1* 32.6* 30.9* 31.1* 30.1*  MCV 85.5   < > 83.1 82.5 83.5 82.9 83.4  PLT 401*   < > 412* 480* 466* 465* 462*   < > = values in this interval not displayed.    Basic Metabolic Panel: Recent Labs  Lab 09/12/19 0211 09/13/19 1056 09/14/19 1734 09/15/19 0548 09/16/19 0248  NA 135 137 130* 135 134*  K 3.1* 3.8 4.3 4.5 4.3  CL 102 104 96* 98 100  CO2 22 21* 20* 24 25  GLUCOSE 98 107* 179* 78 79  BUN 8 6 8 8 9   CREATININE 0.61 0.64 0.69 0.57 0.68  CALCIUM 8.2* 8.8* 8.8* 9.0 9.0  MG 1.9 2.0 1.9 2.0 2.0  PHOS 2.3*  --  3.4 4.0 4.2   GFR: Estimated Creatinine Clearance: 68 mL/min (by C-G formula based on SCr of 0.68 mg/dL). Liver Function Tests: Recent Labs  Lab 09/11/19 0655 09/12/19 0211 09/14/19 1734 09/15/19 0548 09/16/19 0248  AST 78* 66* 38 28 25  ALT 85* 88* 63* 52* 48*  ALKPHOS 56 61 60 54 53  BILITOT 0.4 0.3 0.2* 0.5 0.4  PROT 6.4* 6.2* 7.1 6.8 6.6  ALBUMIN 2.1* 2.1* 2.4* 2.4* 2.5*   No  results for input(s): LIPASE, AMYLASE in the last 168 hours. No results for input(s): AMMONIA in the last 168 hours. Coagulation Profile: No results for input(s): INR, PROTIME in the last 168 hours. Cardiac Enzymes: No results for input(s): CKTOTAL, CKMB, CKMBINDEX, TROPONINI in the last 168 hours. BNP (last 3 results) No results for input(s): PROBNP in the last 8760 hours. HbA1C: No results for input(s): HGBA1C in the last 72 hours. CBG: No results for input(s): GLUCAP in the last 168 hours. Lipid Profile: No results for input(s): CHOL, HDL, LDLCALC, TRIG, CHOLHDL, LDLDIRECT in the last 72 hours. Thyroid Function Tests: No results for input(s): TSH, T4TOTAL, FREET4, T3FREE, THYROIDAB in the last 72 hours. Anemia Panel: No results for input(s): VITAMINB12, FOLATE, FERRITIN, TIBC, IRON, RETICCTPCT in the last 72 hours. Sepsis Labs: No results for input(s): PROCALCITON, LATICACIDVEN in the last 168 hours.  Recent Results (from the past 240 hour(s))  SARS CORONAVIRUS 2 (TAT 6-24 HRS) Nasopharyngeal Nasopharyngeal Swab     Status: None   Collection Time: 09/07/19 12:07 AM   Specimen: Nasopharyngeal Swab  Result Value Ref Range  Status   SARS Coronavirus 2 NEGATIVE NEGATIVE Final    Comment: (NOTE) SARS-CoV-2 target nucleic acids are NOT DETECTED. The SARS-CoV-2 RNA is generally detectable in upper and lower respiratory specimens during the acute phase of infection. Negative results do not preclude SARS-CoV-2 infection, do not rule out co-infections with other pathogens, and should not be used as the sole basis for treatment or other patient management decisions. Negative results must be combined with clinical observations, patient history, and epidemiological information. The expected result is Negative. Fact Sheet for Patients: SugarRoll.be Fact Sheet for Healthcare Providers: https://www.woods-mathews.com/ This test is not yet approved or cleared by the [place] FDA and  has been authorized for detection and/or diagnosis of SARS-CoV-2 by FDA under an Emergency Use Authorization (EUA). This EUA will remain  in effect (meaning this test can be used) for the duration of the COVID-19 declaration under Section 56 4(b)(1) of the Act, 21 U.S.C. section 360bbb-3(b)(1), unless the authorization is terminated or revoked sooner. Performed at [facility], [place] [address], [place], [place] [address]   SARS CORONAVIRUS 2 (TAT 6-24 HRS) Nasopharyngeal Nasopharyngeal Swab     Status: None   Collection Time: 09/07/19  3:47 PM   Specimen: Nasopharyngeal Swab  Result Value Ref Range Status   SARS Coronavirus 2 NEGATIVE NEGATIVE Final    Comment: (NOTE) SARS-CoV-2 target nucleic acids are NOT DETECTED. The SARS-CoV-2 RNA is generally detectable in upper and lower respiratory specimens during the acute phase of infection. Negative results do not preclude SARS-CoV-2 infection, do not rule out co-infections with other pathogens, and should not be used as the sole basis for treatment or other patient management decisions. Negative results must be combined with clinical  observations, patient history, and epidemiological information. The expected result is Negative. Fact Sheet for Patients: SugarRoll.be Fact Sheet for Healthcare Providers: https://www.woods-mathews.com/ This test is not yet approved or cleared by the [place] FDA and  has been authorized for detection and/or diagnosis of SARS-CoV-2 by FDA under an Emergency Use Authorization (EUA). This EUA will remain  in effect (meaning this test can be used) for the duration of the COVID-19 declaration under Section 56 4(b)(1) of the Act, 21 U.S.C. section 360bbb-3(b)(1), unless the authorization is terminated or revoked sooner. Performed at [facility], [place] [address], [place], [place] [address]   Respiratory Panel by PCR  Status: None   Collection Time: 09/08/19 11:43 AM   Specimen: Nasopharyngeal Swab; Respiratory  Result Value Ref Range Status   Adenovirus NOT DETECTED NOT DETECTED Final   Coronavirus 229E NOT DETECTED NOT DETECTED Final    Comment: (NOTE) The Coronavirus on the Respiratory Panel, DOES NOT test for the novel  Coronavirus (2019 nCoV)    Coronavirus HKU1 NOT DETECTED NOT DETECTED Final   Coronavirus NL63 NOT DETECTED NOT DETECTED Final   Coronavirus OC43 NOT DETECTED NOT DETECTED Final   Metapneumovirus NOT DETECTED NOT DETECTED Final   Rhinovirus / Enterovirus NOT DETECTED NOT DETECTED Final   Influenza A NOT DETECTED NOT DETECTED Final   Influenza B NOT DETECTED NOT DETECTED Final   Parainfluenza Virus 1 NOT DETECTED NOT DETECTED Final   Parainfluenza Virus 2 NOT DETECTED NOT DETECTED Final   Parainfluenza Virus 3 NOT DETECTED NOT DETECTED Final   Parainfluenza Virus 4 NOT DETECTED NOT DETECTED Final   Respiratory Syncytial Virus NOT DETECTED NOT DETECTED Final   Bordetella pertussis NOT DETECTED NOT DETECTED Final   Chlamydophila pneumoniae NOT DETECTED NOT DETECTED Final   Mycoplasma pneumoniae NOT DETECTED NOT  DETECTED Final    Comment: Performed at [facility], [place]. [address] [place], [place] [address]  MRSA PCR Screening     Status: None   Collection Time: 09/13/19  3:54 AM   Specimen: Nasopharyngeal  Result Value Ref Range Status   MRSA by PCR NEGATIVE NEGATIVE Final    Comment:        The GeneXpert MRSA Assay (FDA approved for NASAL specimens only), is one component of a comprehensive MRSA colonization surveillance program. It is not intended to diagnose MRSA infection nor to guide or monitor treatment for MRSA infections. Performed at [facility], [place] [address], [place], [place] [address]      Radiology Studies: DG CHEST PORT 1 VIEW  Result Date: 09/14/2019 CLINICAL DATA:  49 year old female with history of hypoxia. EXAM: PORTABLE CHEST 1 VIEW COMPARISON:  Chest x-ray 09/08/2019. FINDINGS: Patchy multifocal ill-defined opacities noted throughout the lungs bilaterally, similar to the recent prior study, compatible with multilobar bilateral pneumonia. Overall, aeration appears unchanged. No pleural effusions. No evidence of pulmonary edema. Heart size is normal. Upper mediastinal contours are within normal limits. IMPRESSION: 1. Persistent multilobar bilateral pneumonia, as above. Given the multiple recent negative COVID-19 test, and a recent diagnosis of HIV, findings are presumably reflective of alternative atypical infectious etiology such as pneumocystis jiroveci pneumonia. Electronically Signed   By: Vinnie Langton M.D.   On: 09/14/2019 10:39     Scheduled Meds: . bictegravir-emtricitabine-tenofovir AF  1 tablet Oral Daily  . enoxaparin (LOVENOX) injection  40 mg Subcutaneous Daily  . guaiFENesin  600 mg Oral BID  . methylPREDNISolone (SOLU-MEDROL) injection  40 mg Intravenous Daily  . pantoprazole (PROTONIX) IV  40 mg Intravenous Q24H  . polyethylene glycol  17 g Oral Daily  . sodium chloride flush  10-40 mL Intracatheter Q12H  . sucralfate  1 g Oral TID WC &  HS   Continuous Infusions: . sulfamethoxazole-trimethoprim 400 mg of trimethoprim (09/15/19 1646)     LOS: 10 days    Flora Lipps, MD [facility] 09/16/2019,

## 2019-09-16 NOTE — Progress Notes (Signed)
Occupational Therapy Treatment Patient Details Name: Deborah Durham MRN: [id number] DOB: [birth year] Today's Date: 09/16/2019    History of present illness 49 y.o. F admitted for 2-week history of SOB, fevers, and decreased appetite. 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 No significant PMH.   OT comments  Pt demonstrates improving activity tolerance and endurance.  She requires min guard to min A for UB ADLs and min A - mod A for LB ADLs.  She requires multiple seated rest breaks and paces self during activity.  HR 117-140 and 02 sats 97-100% on 6L supplemental 02.  Reinforced pursed lip breathing techniques.  Pt will benefit from the consistency and intensity of therapies on CIR to allow her to maximize her independence and safety with ADLs and IADLs.  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  Will follow acutely.   Follow Up Recommendations  CIR    Equipment Recommendations  3 in 1 bedside commode    Recommendations for Other Services      Precautions / Restrictions Precautions Precautions: None       Mobility Bed Mobility Overal bed mobility: Needs Assistance Bed Mobility: Supine to Sit     Supine to sit: Supervision        Transfers Overall transfer level: Needs assistance Equipment used: 2 person hand held assist;1 person hand held assist Transfers: Sit to/from Stand;Stand Pivot Transfers Sit to Stand: Min assist Stand pivot transfers: Min guard       General transfer comment: assist for safety and balance     Balance Overall balance assessment: Needs assistance Sitting-balance support: Feet unsupported;Single extremity supported Sitting balance-Leahy Scale: Good     Standing balance support: Single extremity supported Standing balance-Leahy Scale: Fair                             ADL either performed or assessed with clinical judgement    ADL Overall ADL's : Needs assistance/impaired Eating/Feeding: Independent   Grooming: Wash/dry hands;Wash/dry face;Oral care;Brushing hair;Minimal assistance;Standing   Upper Body Bathing: Minimal assistance;Sitting   Lower Body Bathing: Sit to/from stand;Moderate assistance Lower Body Bathing Details (indicate cue type and reason): due to decreased activity tolerance  Upper Body Dressing : Minimal assistance;Sitting   Lower Body Dressing: Sit to/from stand;Minimal assistance Lower Body Dressing Details (indicate cue type and reason): due to decreased activity tolerance  Toilet Transfer: Minimal assistance;Ambulation;Comfort height toilet;Regular Toilet;Grab bars   Toileting- Clothing Manipulation and Hygiene: Minimal assistance;Sit to/from stand       Functional mobility during ADLs: Minimal assistance General ADL Comments: Pt requires multiple rest breaks, and min A due to fatigue and balance deficits      Vision       Perception     Praxis      Cognition Arousal/Alertness: Awake/alert Behavior During Therapy: Anxious Overall Cognitive Status: Within Functional Limits for tasks assessed                                 General Comments: reinforced deep breathing as a tool to reduce anxiety         Exercises     Shoulder Instructions       General Comments 02 sats 97-100% on 6L supplemental 02, HR 117-140     Pertinent Vitals/ Pain  Pain Assessment: No/denies pain  Home Living                                          Prior Functioning/Environment              Frequency  Min 2X/week        Progress Toward Goals  OT Goals(current goals can now be found in the care plan section)  Progress towards OT goals: Progressing toward goals     Plan Discharge plan remains appropriate    Co-evaluation    PT/OT/SLP Co-Evaluation/Treatment: Yes Reason for Co-Treatment: To address functional/ADL transfers   OT  goals addressed during session: ADL's and self-care      AM-PAC OT "6 Clicks" Daily Activity     Outcome Measure   Help from another person eating meals?: None Help from another person taking care of personal grooming?: A Little Help from another person toileting, which includes using toliet, bedpan, or urinal?: A Little Help from another person bathing (including washing, rinsing, drying)?: A Little Help from another person to put on and taking off regular upper body clothing?: A Little Help from another person to put on and taking off regular lower body clothing?: A Little 6 Click Score: 19    End of Session Equipment Utilized During Treatment: Gait belt  OT Visit Diagnosis: Unsteadiness on feet (R26.81);Other abnormalities of gait and mobility (R26.89);Muscle weakness (generalized) (M62.81)   Activity Tolerance Patient limited by fatigue;Patient tolerated treatment well   Patient Left in chair;with call bell/phone within reach(with PT )   Nurse Communication Mobility status        Time: 1455-1535 OT Time Calculation (min): 40 min  Charges: OT General Charges $OT Visit: 1 Visit OT Treatments $Self Care/Home Management : 8-22 mins $Therapeutic Activity: 8-22 mins  Nilsa Nutting., OTR/L Acute Rehabilitation Services Pager [phone] Office [phone]    Lucille Passy M 09/16/2019, 3:48 PM

## 2019-09-16 NOTE — Progress Notes (Signed)
Physical Therapy Treatment Patient Details Name: Deborah Durham MRN: [id number] DOB: 09-Nov-1970 Today's Date: 09/16/2019    History of Present Illness Pt is a 49 y.o. female admitted 09/06/19 for 2-week history of SOB, fevers, and decreased appetite. 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 No significant PMH.   PT Comments     Pt progressing with mobility. Pt remains limited by generalized weakness, decreased activity tolerance, and poor balance strategies/postural reactions. Pt at high risk for falls, requiring assist to prevent LOB with minimal challenge. SpO2 89-100% on 5-6L O2 [place] with activity; pt requires frequent rest breaks due to DOE. 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 Continue to recommend intensive CIR-level therapies to maximize functional mobility and independence prior to return home.   Follow Up Recommendations  CIR     Equipment Recommendations  (TBD)    Recommendations for Other Services       Precautions / Restrictions Precautions Precautions: Fall Restrictions Weight Bearing Restrictions: No    Mobility  Bed Mobility Overal bed mobility: Needs Assistance Bed Mobility: Supine to Sit     Supine to sit: Supervision     General bed mobility comments: Received sitting EOB with OT  Transfers Overall transfer level: Needs assistance Equipment used: 2 person hand held assist;1 person hand held assist Transfers: Sit to/from Stand Sit to Stand: Min assist Stand pivot transfers: Min guard       General transfer comment: assist for safety and balance   Ambulation/Gait Ambulation/Gait assistance: Min assist Gait Distance (Feet): 28 Feet(+8) Assistive device: 2 person hand held assist;1 person hand held assist;None Gait Pattern/deviations: Step-through pattern;Decreased stride length   Gait velocity interpretation: <1.31 ft/sec, indicative of household  ambulator General Gait Details: Slow, unsteady gait, pt initially reliant on bilat HHA, progressing to single HHA and intermittent minA to prevent LOB. Prolonged seated rest after initial gait distance. Amb additional 4' forwards/backwards without UE support, minA to prevent LOB walking backwards. Pt very slow and guarded; standing rest breaks to control breathing/decrease respiration rate   Stairs             Wheelchair Mobility    Modified Rankin (Stroke Patients Only)       Balance Overall balance assessment: Needs assistance Sitting-balance support: Feet unsupported;Single extremity supported Sitting balance-Leahy Scale: Good     Standing balance support: Single extremity supported;No upper extremity supported Standing balance-Leahy Scale: Fair Standing balance comment: Able to static stand without UE support; unable to accept challenge without LOB                            Cognition Arousal/Alertness: Awake/alert Behavior During Therapy: Anxious Overall Cognitive Status: Within Functional Limits for tasks assessed                                 General Comments: reinforced deep breathing as a tool to reduce anxiety. Pt with good self-awareness of her anxiety levels      Exercises      General Comments General comments (skin integrity, edema, etc.): SpO2 89-100% on 5-6L O2, HR 117-140, post-session BP 113/76      Pertinent Vitals/Pain Pain Assessment: No/denies pain    Home Living  Prior Function            PT Goals (current goals can now be found in the care plan section) Progress towards PT goals: Progressing toward goals    Frequency    Min 3X/week      PT Plan Current plan remains appropriate    Co-evaluation PT/OT/SLP Co-Evaluation/Treatment: Yes Reason for Co-Treatment: To address functional/ADL transfers PT goals addressed during session: Mobility/safety with mobility;Balance OT  goals addressed during session: ADL's and self-care      AM-PAC PT "6 Clicks" Mobility   Outcome Measure    Help needed moving from lying on your back to sitting on the side of a flat bed without using bedrails?: None Help needed moving to and from a bed to a chair (including a wheelchair)?: A Little Help needed standing up from a chair using your arms (e.g., wheelchair or bedside chair)?: A Little Help needed to walk in hospital room?: A Little Help needed climbing 3-5 steps with a railing? : A Lot 6 Click Score: 15    End of Session Equipment Utilized During Treatment: Oxygen Activity Tolerance: Patient tolerated treatment well Patient left: in chair;with call bell/phone within reach;with chair alarm set Nurse Communication: Mobility status PT Visit Diagnosis: Unsteadiness on feet (R26.81);Other abnormalities of gait and mobility (R26.89);Muscle weakness (generalized) (M62.81)     Time: [id number] PT Time Calculation (min) (ACUTE ONLY): 41 min  Charges:  $Gait Training: 8-22 mins $Therapeutic Exercise: 8-22 mins                    Mabeline Caras, PT, DPT Acute Rehabilitation Services  Pager [phone] Office [place] 09/16/2019, 4:04 PM

## 2019-09-16 NOTE — Progress Notes (Signed)
Inpatient Rehabilitation Admissions Coordinator  Inpatient rehab consult received. I met with patient at bedside to discuss goals and expectations of a possible inpt rehab admit. She is in agreement. Once I receive updated PT and OT treatment notes, I will begin insurance approval with [employer] for a possible CIR admit when medically ready .  Danne Baxter, RN, MSN Rehab Admissions Coordinator [phone] 09/16/2019 2:18 PM

## 2019-09-16 NOTE — Progress Notes (Signed)
[facility] for Infectious Disease  Date of Admission:  09/06/2019     Total days of antibiotics 11         ASSESSMENT:  Ms. Freedman continues to experience episodes of nausea and vomiting refractory ondansetron. This remains likely due to the corticosteroids she is receiving. Will decrease to 20 mg of prednisone daily to see if that helps. May need to consider Phenergan. She has remained afebrile and appears her oxygenation and activity intolerance are slowly improving. Certainly cannot exclude Bactrim as source of symptoms but will continue current dose for now. We discussed how HIV leads to opportunistic infection and that taking ART will help to prevent this in the future. Will continue current dose of Biktarvy. Disposition possibly to rehabilitation. She does have copay card activated to receive her Biktarvy at discharge.   PLAN:  1. Continue current dose of Biktarvy and Bactrim. 2. Decrease corticosteroids to 20 mg daily.  3. Recommend encouraging oral intake as tolerated. 4. Wean oxygen as able.   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 bictegravir-emtricitabine-tenofovir AF  1 tablet Oral Daily  . enoxaparin (LOVENOX) injection  40 mg Subcutaneous Daily  . guaiFENesin  600 mg Oral BID  . methylPREDNISolone (SOLU-MEDROL) injection  40 mg Intravenous Daily  . pantoprazole (PROTONIX) IV  40 mg Intravenous Q24H  . polyethylene glycol  17 g Oral Daily  . sodium chloride flush  10-40 mL Intracatheter Q12H  . sucralfate  1 g Oral TID WC & HS    SUBJECTIVE:  Afebrile overnight. Refused Bactrim dose last evening due to episode of nausea and vomiting. Continues to have decreased oral intake. She has increased levels of anxiety, however has been able to improve  her activity levels slowly. Has concerns about how she got to this point.   No Known Allergies   Review of Systems: Review of Systems  Constitutional: Negative for chills, fever and weight loss.  Respiratory: Negative for cough, shortness of breath and wheezing.   Cardiovascular: Negative for chest pain and leg swelling.  Gastrointestinal: Positive for nausea and vomiting. Negative for abdominal pain, constipation and diarrhea.  Skin: Negative for rash.      OBJECTIVE: Vitals:   09/15/19 2130 09/15/19 2300 09/16/19 0426 09/16/19 0700  BP: (!) 123/91 110/88 (!) 126/97 99/71  Pulse: 83 78 96 94  Resp: 18 18 18 20   Temp: 97.6 F (36.4 C) 97.8 F (36.6 C) 98.3 F (36.8 C) (!) 97.3 F (36.3 C)  TempSrc: Oral Oral Oral Oral  SpO2: 100% 100% 100% 99%  Weight:      Height:       Body mass index is 24.14 kg/m.  Physical Exam Constitutional:      General: She is not in acute distress.    Appearance: She is well-developed.  Eyes:     Conjunctiva/sclera: Conjunctivae normal.  Cardiovascular:     Rate and Rhythm: Normal rate and regular rhythm.     Heart sounds: Normal heart sounds. No murmur. No friction rub. No gallop.   Pulmonary:     Effort: Pulmonary effort is normal. No respiratory distress.     Breath sounds: Normal breath sounds. No wheezing or rales.  Chest:     Chest wall: No tenderness.  Abdominal:  General: Bowel sounds are normal.     Palpations: Abdomen is soft.     Tenderness: There is no abdominal tenderness.  Musculoskeletal:     Cervical back: Neck supple.  Lymphadenopathy:     Cervical: No cervical adenopathy.  Skin:    General: Skin is warm and dry.     Findings: No rash.  Neurological:     Mental Status: She is alert and oriented to person, place, and time.  Psychiatric:        Behavior: Behavior normal.        Thought Content: Thought content normal.        Judgment: Judgment normal.     Lab Results Lab Results  Component Value Date    WBC 8.2 09/16/2019   HGB 9.9 (L) 09/16/2019   HCT 30.1 (L) 09/16/2019   MCV 83.4 09/16/2019   PLT 462 (H) 09/16/2019    Lab Results  Component Value Date   CREATININE 0.68 09/16/2019   BUN 9 09/16/2019   NA 134 (L) 09/16/2019   K 4.3 09/16/2019   CL 100 09/16/2019   CO2 25 09/16/2019    Lab Results  Component Value Date   ALT 48 (H) 09/16/2019   AST 25 09/16/2019   ALKPHOS 53 09/16/2019   BILITOT 0.4 09/16/2019     Microbiology: Recent Results (from the past 240 hour(s))  SARS CORONAVIRUS 2 (TAT 6-24 HRS) Nasopharyngeal Nasopharyngeal Swab     Status: None   Collection Time: 09/07/19 12:07 AM   Specimen: Nasopharyngeal Swab  Result Value Ref Range Status   SARS Coronavirus 2 NEGATIVE NEGATIVE Final    Comment: (NOTE) SARS-CoV-2 target nucleic acids are NOT DETECTED. The SARS-CoV-2 RNA is generally detectable in upper and lower respiratory specimens during the acute phase of infection. Negative results do not preclude SARS-CoV-2 infection, do not rule out co-infections with other pathogens, and should not be used as the sole basis for treatment or other patient management decisions. Negative results must be combined with clinical observations, patient history, and epidemiological information. The expected result is Negative. Fact Sheet for Patients: SugarRoll.be Fact Sheet for Healthcare Providers: https://www.woods-mathews.com/ This test is not yet approved or cleared by the [place] FDA and  has been authorized for detection and/or diagnosis of SARS-CoV-2 by FDA under an Emergency Use Authorization (EUA). This EUA will remain  in effect (meaning this test can be used) for the duration of the COVID-19 declaration under Section 56 4(b)(1) of the Act, 21 U.S.C. section 360bbb-3(b)(1), unless the authorization is terminated or revoked sooner. Performed at [facility], [place] [address], [place], [place] [address]    SARS CORONAVIRUS 2 (TAT 6-24 HRS) Nasopharyngeal Nasopharyngeal Swab     Status: None   Collection Time: 09/07/19  3:47 PM   Specimen: Nasopharyngeal Swab  Result Value Ref Range Status   SARS Coronavirus 2 NEGATIVE NEGATIVE Final    Comment: (NOTE) SARS-CoV-2 target nucleic acids are NOT DETECTED. The SARS-CoV-2 RNA is generally detectable in upper and lower respiratory specimens during the acute phase of infection. Negative results do not preclude SARS-CoV-2 infection, do not rule out co-infections with other pathogens, and should not be used as the sole basis for treatment or other patient management decisions. Negative results must be combined with clinical observations, patient history, and epidemiological information. The expected result is Negative. Fact Sheet for Patients: SugarRoll.be Fact Sheet for Healthcare Providers: https://www.woods-mathews.com/ This test is not yet approved or cleared by the [place] FDA  and  has been authorized for detection and/or diagnosis of SARS-CoV-2 by FDA under an Emergency Use Authorization (EUA). This EUA will remain  in effect (meaning this test can be used) for the duration of the COVID-19 declaration under Section 56 4(b)(1) of the Act, 21 U.S.C. section 360bbb-3(b)(1), unless the authorization is terminated or revoked sooner. Performed at [facility], [place] [address], [place], [place] [address]   Respiratory Panel by PCR     Status: None   Collection Time: 09/08/19 11:43 AM   Specimen: Nasopharyngeal Swab; Respiratory  Result Value Ref Range Status   Adenovirus NOT DETECTED NOT DETECTED Final   Coronavirus 229E NOT DETECTED NOT DETECTED Final    Comment: (NOTE) The Coronavirus on the Respiratory Panel, DOES NOT test for the novel  Coronavirus (2019 nCoV)    Coronavirus HKU1 NOT DETECTED NOT DETECTED Final   Coronavirus NL63 NOT DETECTED NOT DETECTED Final   Coronavirus OC43 NOT  DETECTED NOT DETECTED Final   Metapneumovirus NOT DETECTED NOT DETECTED Final   Rhinovirus / Enterovirus NOT DETECTED NOT DETECTED Final   Influenza A NOT DETECTED NOT DETECTED Final   Influenza B NOT DETECTED NOT DETECTED Final   Parainfluenza Virus 1 NOT DETECTED NOT DETECTED Final   Parainfluenza Virus 2 NOT DETECTED NOT DETECTED Final   Parainfluenza Virus 3 NOT DETECTED NOT DETECTED Final   Parainfluenza Virus 4 NOT DETECTED NOT DETECTED Final   Respiratory Syncytial Virus NOT DETECTED NOT DETECTED Final   Bordetella pertussis NOT DETECTED NOT DETECTED Final   Chlamydophila pneumoniae NOT DETECTED NOT DETECTED Final   Mycoplasma pneumoniae NOT DETECTED NOT DETECTED Final    Comment: Performed at [facility], [place]. [address] [place], [place] [address]  MRSA PCR Screening     Status: None   Collection Time: 09/13/19  3:54 AM   Specimen: Nasopharyngeal  Result Value Ref Range Status   MRSA by PCR NEGATIVE NEGATIVE Final    Comment:        The GeneXpert MRSA Assay (FDA approved for NASAL specimens only), is one component of a comprehensive MRSA colonization surveillance program. It is not intended to diagnose MRSA infection nor to guide or monitor treatment for MRSA infections. Performed at [facility], [place] [address] [place], [place] [address]      Terri Piedra, [facility] [phone] Pager  09/16/2019  10:05 AM

## 2019-09-17 DIAGNOSIS — R1110 Vomiting, unspecified: Secondary | ICD-10-CM

## 2019-09-17 DIAGNOSIS — F419 Anxiety disorder, unspecified: Secondary | ICD-10-CM

## 2019-09-17 LAB — COMPREHENSIVE METABOLIC PANEL
ALT: 33 U/L (ref 0–44)
AST: 20 U/L (ref 15–41)
Albumin: 2.3 g/dL — ABNORMAL LOW (ref 3.5–5.0)
Alkaline Phosphatase: 49 U/L (ref 38–126)
Anion gap: 10 (ref 5–15)
BUN: 14 mg/dL (ref 6–20)
CO2: 23 mmol/L (ref 22–32)
Calcium: 8.7 mg/dL — ABNORMAL LOW (ref 8.9–10.3)
Chloride: 104 mmol/L (ref 98–111)
Creatinine, Ser: 0.79 mg/dL (ref 0.44–1.00)
GFR calc Af Amer: >60 mL/min (ref >60)
GFR calc non Af Amer: >60 mL/min (ref >60)
Glucose, Bld: 133 mg/dL — ABNORMAL HIGH (ref 70–99)
Potassium: 3.4 mmol/L — ABNORMAL LOW (ref 3.5–5.1)
Sodium: 137 mmol/L (ref 135–145)
Total Bilirubin: 0.3 mg/dL (ref 0.3–1.2)
Total Protein: 6.1 g/dL — ABNORMAL LOW (ref 6.5–8.1)

## 2019-09-17 LAB — CBC
HCT: 29 % — ABNORMAL LOW (ref 36.0–46.0)
Hemoglobin: 9.1 g/dL — ABNORMAL LOW (ref 12.0–15.0)
MCH: 27.1 pg (ref 26.0–34.0)
MCHC: 31.4 g/dL (ref 30.0–36.0)
MCV: 86.3 fL (ref 80.0–100.0)
Platelets: 451 10*3/uL — ABNORMAL HIGH (ref 150–400)
RBC: 3.36 MIL/uL — ABNORMAL LOW (ref 3.87–5.11)
RDW: 15.4 % (ref 11.5–15.5)
WBC: 13.5 10*3/uL — ABNORMAL HIGH (ref 4.0–10.5)
nRBC: 0 % (ref 0.0–0.2)

## 2019-09-17 LAB — MAGNESIUM: Magnesium: 1.7 mg/dL (ref 1.7–2.4)

## 2019-09-17 LAB — PHOSPHORUS: Phosphorus: 2.2 mg/dL — ABNORMAL LOW (ref 2.5–4.6)

## 2019-09-17 MED ORDER — SULFAMETHOXAZOLE-TRIMETHOPRIM 400-80 MG/5ML IV SOLN
400.0000 mg | Freq: Three times a day (TID) | INTRAVENOUS | Status: DC
  Administered 2019-09-17 (×2): 400 mg via INTRAVENOUS
  Filled 2019-09-17 (×5): qty 25

## 2019-09-17 MED ORDER — K PHOS MONO-SOD PHOS DI & MONO 155-852-130 MG PO TABS
500.0000 mg | ORAL_TABLET | Freq: Three times a day (TID) | ORAL | Status: DC
  Administered 2019-09-17 – 2019-09-18 (×4): 500 mg via ORAL
  Filled 2019-09-17 (×6): qty 2

## 2019-09-17 MED ORDER — POTASSIUM PHOSPHATE MONOBASIC 500 MG PO TABS: 500.0000 mg | ORAL_TABLET | Freq: Three times a day (TID) | ORAL | Status: DC

## 2019-09-17 MED ORDER — PROMETHAZINE HCL 25 MG/ML IJ SOLN: 25.0000 mg | Freq: Four times a day (QID) | INTRAMUSCULAR | Status: DC | PRN

## 2019-09-17 NOTE — Progress Notes (Addendum)
PROGRESS NOTE    ADVITHA FINTON  [id number] DOB: 08-01-1971 DOA: 09/06/2019 PCP: Patient, No Pcp Per   Brief Narrative:  As per HPI,   Patient is a 49 year old female with no significant past medical history presented to the hospital with  2-week history of shortness of breath, subjective fevers and decreased appetite with dyspnea on exertion with multiple negative Covid tests prior to admission and on admission.  Negative respiratory panel.  Patient was found to have positive HIV antigen and multifocal pneumonia on CT scan.  She was Started on empiric treatment for pneumocystis pneumonia by pulmonology with Solu-Medrol 40 mg IV twice daily as well as Bactrim and continued on empiric CAP therapy with Rocephin and azithromycin.  ID was consulted for HIV eval.  Patient was HIV 1 antibody positive and low CD4 count confirms advanced HIV which has been complicated by suspected pneumocystis pneumonia.  Bactrim and steroids continued, started on Biktarvy. CAP therapy discontinued.  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  On 3. L  of nasal cannula at this time.  Repeat chest x-ray from 09/14/2019 showed persistent multilobar bilateral pneumonia.    Not on antibiotics.  Continue prednisone and bactrim. ID  on board.  Procalcitonin at 0.4 on 09/08/2019.  Continue incentive spirometry, flutter valve. Wean oxygen as able.  Urinary Legionella antigen was negative.    Newly diagnosed HIV Absolute CD4 count of less than 35 on 09/09/2019.  Patient currently on prednisone, bactrim and on started  on Biktarvy per ID.  ID on board. Indeterminate QuantiFERON test, ANA negative. Mildly elevated rheumatoid factor.  Trace pericardial effusion Recommend outpatient follow-up.  Elevated LFT. Could be secondary to HIV.  Hepatitis C antibody nonreactive.  Improved.  Nausea and vomiting, possible gastritits. Much improved today. On Po bactrim and prednisone. Continue Maalox, sucralfate and MiraLAX . Phenergan suppository for intractable nausea.    Patient stated that he has a history of gastritis and ulcer in the past has had EGD in the past. DC IV fluids.  Mild hypokalemia.  Patient will be on K-Phos tablets.  Check BMP in a.m.  Mild hypophosphatemia.  Latest Phos of 2.2.  We will add K-Phos tablets.  Mild hyponatremia.  Sodium of 137 today.  Improved  Anxiety secondary to acute illness.  Ativan PRN   DVT prophylaxis: Lovenox subcu  Code Status: Full code  Family Communication:  None.  Patient wishes to communicate by herself  Disposition Plan: CIR  pending, okay from ID point of view for CIR once oral intake is adequate.   Consultants:   Infectious disease: Dr. Megan Salon 09/08/2019  PCCM: Dr. Shearon Stalls 09/08/2019  CIR  Procedures:   2D echo 09/08/2019  Chest x-ray 09/06/2019, 09/08/2019  CT angiogram chest 09/06/2019  Antimicrobials:   Biktarvy 09/09/2019  Oral Bactrim 09/08/2019>>> 09/09/2019  IV azithromycin 09/06/2019>>>>> 09/09/2019  IV Rocephin 09/06/2019>> 09/09/2019  IV Bactrim 09/09/2019  Subjective:  Today, feels better with nausea vomiting today.  Denies abdominal pain.  Has mild shortness of breath and dyspnea.  Objective: Vitals:   09/17/19 0109 09/17/19 0416 09/17/19 0608 09/17/19 0609  BP:  134/78 107/78   Pulse:  89 86 76  Resp: 19 18 (!) 24 20  Temp:  98 F (36.7 C) 98.5 F (36.9 C)   TempSrc:  Oral Oral   SpO2:  94% 100% 100%  Weight:      Height:        Intake/Output Summary (Last 24 hours) at 09/17/2019 0705 Last data filed at 09/17/2019  0600 Gross per 24 hour  Intake 1277.69 ml  Output --  Net 1277.69 ml   Filed Weights   09/06/19 1552  Weight: 59.9 kg   Body mass index is 24.14 kg/m.   Examination:  General: Alert awake and communicative. On nasal cannula 3 L/ minute.  Weak and deconditioned. HENT: Normocephalic, pupils equally reacting to light and accommodation.  No scleral pallor or icterus noted. Oral mucosa is moist.  Chest:  Diminished breath sounds bilaterally.  Coarse breath sounds noted. CVS: S1 &S2 heard. No murmur.  Regular rate and rhythm. Abdomen: Soft, nontender, nondistended.  Bowel sounds are heard.  Extremities: No cyanosis, clubbing or edema.  Peripheral pulses are palpable. Psych: Alert, awake and oriented, normal mood.  Slow to respond CNS:  No cranial nerve deficits.  Power equal in all extremities.  Skin: Warm and dry.  No rashes noted.   Data Reviewed: I have personally reviewed following labs and imaging studies  CBC: Recent Labs  Lab 09/11/19 0655 09/11/19 0655 09/12/19 0211 09/13/19 1056 09/14/19 1734 09/15/19 0548 09/16/19 0248  WBC 16.9*   < > 12.1* 15.0* 12.9* 10.5 8.2  NEUTROABS 15.4*  --   --   --   --   --   --   HGB 9.3*   < > 9.6* 10.6* 10.2* 10.2* 9.9*  HCT 29.5*   < > 30.1* 32.6* 30.9* 31.1* 30.1*  MCV 85.5   < > 83.1 82.5 83.5 82.9 83.4  PLT 401*   < > 412* 480* 466* 465* 462*   < > = values in this interval not displayed.   Basic Metabolic Panel: Recent Labs  Lab 09/12/19 0211 09/13/19 1056 09/14/19 1734 09/15/19 0548 09/16/19 0248  NA 135 137 130* 135 134*  K 3.1* 3.8 4.3 4.5 4.3  CL 102 104 96* 98 100  CO2 22 21* 20* 24 25  GLUCOSE 98 107* 179* 78 79  BUN 8 6 8 8 9   CREATININE 0.61 0.64 0.69 0.57 0.68  CALCIUM 8.2* 8.8* 8.8* 9.0 9.0  MG 1.9 2.0 1.9 2.0 2.0  PHOS 2.3*  --  3.4 4.0 4.2   GFR: Estimated Creatinine Clearance: 68 mL/min (by C-G formula based on SCr of 0.68 mg/dL). Liver Function Tests: Recent Labs  Lab 09/11/19 0655  09/12/19 0211 09/14/19 1734 09/15/19 0548 09/16/19 0248  AST 78* 66* 38 28 25  ALT 85* 88* 63* 52* 48*  ALKPHOS 56 61 60 54 53  BILITOT 0.4 0.3 0.2* 0.5 0.4  PROT 6.4* 6.2* 7.1 6.8 6.6  ALBUMIN 2.1* 2.1* 2.4* 2.4* 2.5*   No results for input(s): LIPASE, AMYLASE in the last 168 hours. No results for input(s): AMMONIA in the last 168 hours. Coagulation Profile: No results for input(s): INR, PROTIME in the last 168 hours. Cardiac Enzymes: No results for input(s): CKTOTAL, CKMB, CKMBINDEX, TROPONINI in the last 168 hours. BNP (last 3 results) No results for input(s): PROBNP in the last 8760 hours. HbA1C: No results for input(s): HGBA1C in the last 72 hours. CBG: No results for input(s): GLUCAP in the last 168 hours.  Lipid Profile: No results for input(s): CHOL, HDL, LDLCALC, TRIG, CHOLHDL, LDLDIRECT in the last 72 hours. Thyroid Function Tests: No results for input(s): TSH, T4TOTAL, FREET4, T3FREE, THYROIDAB in the last 72 hours. Anemia Panel: No results for input(s): VITAMINB12, FOLATE, FERRITIN, TIBC, IRON, RETICCTPCT in the last 72 hours. Sepsis Labs: No results for input(s): PROCALCITON, LATICACIDVEN in the last 168 hours.  Recent Results (from the past 240 hour(s))  SARS CORONAVIRUS 2 (TAT 6-24 HRS) Nasopharyngeal Nasopharyngeal Swab     Status: None   Collection Time: 09/07/19  3:47 PM   Specimen: Nasopharyngeal Swab  Result Value Ref Range Status   SARS Coronavirus 2 NEGATIVE NEGATIVE Final    Comment: (NOTE) SARS-CoV-2 target nucleic acids are NOT DETECTED. The SARS-CoV-2 RNA is generally detectable in upper and lower respiratory specimens during the acute phase of infection. Negative results do not preclude SARS-CoV-2 infection, do not rule out co-infections with other pathogens, and should not be used as the sole basis for treatment or other patient management decisions. Negative results must be combined with clinical observations, patient history, and  epidemiological information. The expected result is Negative. Fact Sheet for Patients: SugarRoll.be Fact Sheet for Healthcare Providers: https://www.woods-mathews.com/ This test is not yet approved or cleared by the [place] FDA and  has been authorized for detection and/or diagnosis of SARS-CoV-2 by FDA under an Emergency Use Authorization (EUA). This EUA will remain  in effect (meaning this test can be used) for the duration of the COVID-19 declaration under Section 56 4(b)(1) of the Act, 21 U.S.C. section 360bbb-3(b)(1), unless the authorization is terminated or revoked sooner. Performed at [facility], [place] [address] [place], [place] [address]   Respiratory Panel by PCR     Status: None   Collection Time: 09/08/19 11:43 AM   Specimen: Nasopharyngeal Swab; Respiratory  Result Value Ref Range Status   Adenovirus NOT DETECTED NOT DETECTED Final   Coronavirus 229E NOT DETECTED NOT DETECTED Final    Comment: (NOTE) The Coronavirus on the Respiratory Panel, DOES NOT test for the novel  Coronavirus (2019 nCoV)    Coronavirus HKU1 NOT DETECTED NOT DETECTED Final   Coronavirus NL63 NOT DETECTED NOT DETECTED Final   Coronavirus OC43 NOT DETECTED NOT DETECTED Final   Metapneumovirus NOT DETECTED NOT DETECTED Final   Rhinovirus / Enterovirus NOT DETECTED NOT DETECTED Final   Influenza A NOT DETECTED NOT DETECTED Final   Influenza B NOT DETECTED NOT DETECTED Final   Parainfluenza Virus 1 NOT DETECTED NOT DETECTED Final   Parainfluenza Virus 2 NOT DETECTED NOT DETECTED Final   Parainfluenza Virus 3 NOT DETECTED NOT DETECTED Final   Parainfluenza Virus 4 NOT DETECTED NOT DETECTED Final   Respiratory Syncytial Virus NOT DETECTED NOT DETECTED Final   Bordetella pertussis NOT DETECTED NOT DETECTED Final   Chlamydophila pneumoniae NOT DETECTED NOT DETECTED Final   Mycoplasma pneumoniae NOT DETECTED NOT DETECTED Final    Comment: Performed  at [facility], [place]. [address] [place], [place] [address]  MRSA PCR Screening     Status: None   Collection Time: 09/13/19  3:54 AM   Specimen: Nasopharyngeal  Result Value Ref Range Status   MRSA by PCR NEGATIVE NEGATIVE Final    Comment:        The GeneXpert MRSA Assay (FDA approved for NASAL specimens only), is one component of a comprehensive MRSA colonization surveillance program. It is not intended to diagnose MRSA infection nor to guide or monitor treatment for  MRSA infections. Performed at [facility], [place] [address], [place], [place] [address]      Radiology Studies: No results found.   Scheduled Meds: . bictegravir-emtricitabine-tenofovir AF  1 tablet Oral Daily  . enoxaparin (LOVENOX) injection  40 mg Subcutaneous Daily  . guaiFENesin  600 mg Oral BID  . pantoprazole (PROTONIX) IV  40 mg Intravenous Q24H  . polyethylene glycol  17 g Oral Daily  . predniSONE  20 mg Oral Q breakfast  . sodium chloride flush  10-40 mL Intracatheter Q12H  . sucralfate  1 g Oral TID WC & HS  . sulfamethoxazole-trimethoprim  2 tablet Oral Q8H   Continuous Infusions: . sodium chloride 75 mL/hr at 09/17/19 0153     LOS: 11 days    Flora Lipps, MD [facility] 09/17/2019,

## 2019-09-17 NOTE — Progress Notes (Signed)
[facility] for Infectious Disease  Date of Admission:  09/06/2019     Total days of antibiotics 12         ASSESSMENT:  Ms. Edlin nausea is improving and has not had any vomiting in the last 24 hours, however unfortunately she has not taken her medications. Discusses importance of taking the Bactrim and Biktarvy in order to help cure her infection and control her HIV/AIDS. To help with nausea will add phenergan as needed and will space out medications. Will keep Bactrim IV for now and discontinue prednisone. She does continue to have increasing levels of anxiety. Disposition at the present time appears to be to CIR.   PLAN:  1. Continue current Biktarvy. 2. Change Bactrim to IV 3. Discontinue prednisone.  4. Add phenergan 25 mg IV q 6 prn nausea.  5. Anxiety management per primary team.   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 bictegravir-emtricitabine-tenofovir AF  1 tablet Oral Daily  . enoxaparin (LOVENOX) injection  40 mg Subcutaneous Daily  . guaiFENesin  600 mg Oral BID  . pantoprazole (PROTONIX) IV  40 mg Intravenous Q24H  . polyethylene glycol  17 g Oral Daily  . sodium chloride flush  10-40 mL Intracatheter Q12H  . sucralfate  1 g Oral TID WC & HS    SUBJECTIVE:  Afebrile overnight with no acute concerns. Feeling better today and has no vomiting in the last 24 hours.   No Known Allergies   Review of Systems: Review of Systems  Constitutional: Negative for chills, fever and weight loss.  Respiratory: Negative for cough, shortness of breath and wheezing.   Cardiovascular: Negative for chest pain and leg swelling.  Gastrointestinal: Negative for abdominal pain, constipation, diarrhea, nausea and vomiting.  Skin: Negative for rash.       OBJECTIVE: Vitals:   09/17/19 0416 09/17/19 0608 09/17/19 0609 09/17/19 0756  BP: 134/78 107/78  104/72  Pulse: 89 86 76   Resp: 18 (!) 24 20 (!) 27  Temp: 98 F (36.7 C) 98.5 F (36.9 C)  97.6 F (36.4 C)  TempSrc: Oral Oral  Oral  SpO2: 94% 100% 100% 100%  Weight:      Height:       Body mass index is 24.14 kg/m.  Physical Exam Constitutional:      General: She is not in acute distress.    Appearance: She is well-developed.     Interventions: Nasal cannula in place.     Comments: Lying in bed with head of bed elevated; pleasant and anxious.   Cardiovascular:     Rate and Rhythm: Normal rate and regular rhythm.     Heart sounds: Normal heart sounds.  Pulmonary:     Effort: Pulmonary effort is normal.     Breath sounds: Normal breath sounds.  Skin:    General: Skin is warm and dry.  Neurological:     Mental Status: She is alert and oriented to person, place, and time.  Psychiatric:        Mood and Affect: Mood is anxious.        Thought Content: Thought content normal.        Judgment: Judgment normal.     Lab Results Lab Results  Component Value Date  WBC 8.2 09/16/2019   HGB 9.9 (L) 09/16/2019   HCT 30.1 (L) 09/16/2019   MCV 83.4 09/16/2019   PLT 462 (H) 09/16/2019    Lab Results  Component Value Date   CREATININE 0.68 09/16/2019   BUN 9 09/16/2019   NA 134 (L) 09/16/2019   K 4.3 09/16/2019   CL 100 09/16/2019   CO2 25 09/16/2019    Lab Results  Component Value Date   ALT 48 (H) 09/16/2019   AST 25 09/16/2019   ALKPHOS 53 09/16/2019   BILITOT 0.4 09/16/2019     Microbiology: Recent Results (from the past 240 hour(s))  SARS CORONAVIRUS 2 (TAT 6-24 HRS) Nasopharyngeal Nasopharyngeal Swab     Status: None   Collection Time: 09/07/19  3:47 PM   Specimen: Nasopharyngeal Swab  Result Value Ref Range Status   SARS Coronavirus 2 NEGATIVE NEGATIVE Final    Comment: (NOTE) SARS-CoV-2 target nucleic acids are NOT DETECTED. The SARS-CoV-2  RNA is generally detectable in upper and lower respiratory specimens during the acute phase of infection. Negative results do not preclude SARS-CoV-2 infection, do not rule out co-infections with other pathogens, and should not be used as the sole basis for treatment or other patient management decisions. Negative results must be combined with clinical observations, patient history, and epidemiological information. The expected result is Negative. Fact Sheet for Patients: SugarRoll.be Fact Sheet for Healthcare Providers: https://www.woods-mathews.com/ This test is not yet approved or cleared by the [place] FDA and  has been authorized for detection and/or diagnosis of SARS-CoV-2 by FDA under an Emergency Use Authorization (EUA). This EUA will remain  in effect (meaning this test can be used) for the duration of the COVID-19 declaration under Section 56 4(b)(1) of the Act, 21 U.S.C. section 360bbb-3(b)(1), unless the authorization is terminated or revoked sooner. Performed at [facility], [place] [address] [place], [place] [address]   Respiratory Panel by PCR     Status: None   Collection Time: 09/08/19 11:43 AM   Specimen: Nasopharyngeal Swab; Respiratory  Result Value Ref Range Status   Adenovirus NOT DETECTED NOT DETECTED Final   Coronavirus 229E NOT DETECTED NOT DETECTED Final    Comment: (NOTE) The Coronavirus on the Respiratory Panel, DOES NOT test for the novel  Coronavirus (2019 nCoV)    Coronavirus HKU1 NOT DETECTED NOT DETECTED Final   Coronavirus NL63 NOT DETECTED NOT DETECTED Final   Coronavirus OC43 NOT DETECTED NOT DETECTED Final   Metapneumovirus NOT DETECTED NOT DETECTED Final   Rhinovirus / Enterovirus NOT DETECTED NOT DETECTED Final   Influenza A NOT DETECTED NOT DETECTED Final   Influenza B NOT DETECTED NOT DETECTED Final   Parainfluenza Virus 1 NOT DETECTED NOT DETECTED Final   Parainfluenza Virus 2 NOT  DETECTED NOT DETECTED Final   Parainfluenza Virus 3 NOT DETECTED NOT DETECTED Final   Parainfluenza Virus 4 NOT DETECTED NOT DETECTED Final   Respiratory Syncytial Virus NOT DETECTED NOT DETECTED Final   Bordetella pertussis NOT DETECTED NOT DETECTED Final   Chlamydophila pneumoniae NOT DETECTED NOT DETECTED Final   Mycoplasma pneumoniae NOT DETECTED NOT DETECTED Final    Comment: Performed at [facility], [place]. [address] [place], [place] [address]  MRSA PCR Screening     Status: None   Collection Time: 09/13/19  3:54 AM   Specimen: Nasopharyngeal  Result Value Ref Range Status   MRSA by PCR NEGATIVE NEGATIVE Final    Comment:  The GeneXpert MRSA Assay (FDA approved for NASAL specimens only), is one component of a comprehensive MRSA colonization surveillance program. It is not intended to diagnose MRSA infection nor to guide or monitor treatment for MRSA infections. Performed at [facility], [place] [address] [place], [place] [address]      Terri Piedra, [place] for [facility] [phone] Pager  09/17/2019  10:37 AM

## 2019-09-18 DIAGNOSIS — Z79899 Other long term (current) drug therapy: Secondary | ICD-10-CM

## 2019-09-18 LAB — COMPREHENSIVE METABOLIC PANEL
ALT: 28 U/L (ref 0–44)
AST: 20 U/L (ref 15–41)
Albumin: 2.2 g/dL — ABNORMAL LOW (ref 3.5–5.0)
Alkaline Phosphatase: 49 U/L (ref 38–126)
Anion gap: 11 (ref 5–15)
BUN: 6 mg/dL (ref 6–20)
CO2: 24 mmol/L (ref 22–32)
Calcium: 8.5 mg/dL — ABNORMAL LOW (ref 8.9–10.3)
Chloride: 101 mmol/L (ref 98–111)
Creatinine, Ser: 0.72 mg/dL (ref 0.44–1.00)
GFR calc Af Amer: >60 mL/min (ref >60)
GFR calc non Af Amer: >60 mL/min (ref >60)
Glucose, Bld: 142 mg/dL — ABNORMAL HIGH (ref 70–99)
Potassium: 3.3 mmol/L — ABNORMAL LOW (ref 3.5–5.1)
Sodium: 136 mmol/L (ref 135–145)
Total Bilirubin: 0.3 mg/dL (ref 0.3–1.2)
Total Protein: 5.9 g/dL — ABNORMAL LOW (ref 6.5–8.1)

## 2019-09-18 LAB — PHOSPHORUS: Phosphorus: 3.1 mg/dL (ref 2.5–4.6)

## 2019-09-18 LAB — CBC
HCT: 29.2 % — ABNORMAL LOW (ref 36.0–46.0)
Hemoglobin: 9.2 g/dL — ABNORMAL LOW (ref 12.0–15.0)
MCH: 27.1 pg (ref 26.0–34.0)
MCHC: 31.5 g/dL (ref 30.0–36.0)
MCV: 85.9 fL (ref 80.0–100.0)
Platelets: 365 10*3/uL (ref 150–400)
RBC: 3.4 MIL/uL — ABNORMAL LOW (ref 3.87–5.11)
RDW: 15.3 % (ref 11.5–15.5)
WBC: 5.9 10*3/uL (ref 4.0–10.5)
nRBC: 0 % (ref 0.0–0.2)

## 2019-09-18 LAB — MAGNESIUM: Magnesium: 1.7 mg/dL (ref 1.7–2.4)

## 2019-09-18 MED ORDER — POTASSIUM CHLORIDE 10 MEQ/100ML IV SOLN
10.0000 meq | INTRAVENOUS | Status: AC
  Administered 2019-09-18 (×2): 10 meq via INTRAVENOUS
  Filled 2019-09-18: qty 100

## 2019-09-18 MED ORDER — MAGNESIUM SULFATE 2 GM/50ML IV SOLN
2.0000 g | Freq: Once | INTRAVENOUS | Status: AC
  Administered 2019-09-18: 2 g via INTRAVENOUS
  Filled 2019-09-18: qty 50

## 2019-09-18 MED ORDER — PROMETHAZINE HCL 25 MG/ML IJ SOLN
25.0000 mg | Freq: Three times a day (TID) | INTRAMUSCULAR | Status: DC
  Administered 2019-09-18 – 2019-09-19 (×2): 25 mg via INTRAVENOUS
  Filled 2019-09-18 (×2): qty 1

## 2019-09-18 MED ORDER — POTASSIUM CHLORIDE 10 MEQ/100ML IV SOLN
INTRAVENOUS | Status: AC
  Filled 2019-09-18: qty 100

## 2019-09-18 MED ORDER — PROMETHAZINE HCL 25 MG/ML IJ SOLN: 25.0000 mg | Freq: Three times a day (TID) | INTRAMUSCULAR | Status: DC

## 2019-09-18 MED ORDER — SULFAMETHOXAZOLE-TRIMETHOPRIM 800-160 MG PO TABS
2.0000 | ORAL_TABLET | Freq: Three times a day (TID) | ORAL | Status: DC
  Administered 2019-09-18 – 2019-09-20 (×6): 2 via ORAL
  Filled 2019-09-18 (×6): qty 2

## 2019-09-18 NOTE — Progress Notes (Signed)
Inpatient Rehabilitation Admissions Coordinator  I met with patient at bedside. Patient walked in the hallway with OT today for the first time. Her daughter, Lesly Rubenstein, is in town from [place] and plans to remain and work remotely to assist with her Mom's recovery. I do not have a CIR bed available for this patient today. With continued improvement, patient would like to go home in a few days if possible. Noted on 5 liters Star today. I will follow up , but would recommend beginning home set up for d/c if CIR continues not to be available. Pt is aware that she will likely d/ home with oxygen . I will alert RN CM, Kelli.  Danne Baxter, RN, MSN Rehab Admissions Coordinator [phone] 09/18/2019 11:22 AM

## 2019-09-18 NOTE — Progress Notes (Signed)
[facility] for Infectious Disease  Date of Admission:  09/06/2019     Total days of antibiotics 13         ASSESSMENT:  Ms. Filtz continues to have challenges with nausea associated with her medication administration. It appears there has been confusion about her nausea medication being carafate. Will schedule promethazine 30 minutes prior to dose of Bactrim to see if that helps with her nausea. Discussed importance of taking Bactrim and Biktarvy to help cure her PCP pneumonia.   PLAN:  1. Continue current dose of Bactrim and Biktarvy 2. Schedule promethazine to help with nausea.    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 bictegravir-emtricitabine-tenofovir AF  1 tablet Oral Daily  . enoxaparin (LOVENOX) injection  40 mg Subcutaneous Daily  . guaiFENesin  600 mg Oral BID  . pantoprazole (PROTONIX) IV  40 mg Intravenous Q24H  . phosphorus  500 mg Oral TID WC & HS  . polyethylene glycol  17 g Oral Daily  . promethazine  25 mg Intravenous Q8H  . sodium chloride flush  10-40 mL Intracatheter Q12H  . sucralfate  1 g Oral TID WC & HS  . sulfamethoxazole-trimethoprim  2 tablet Oral Q8H    SUBJECTIVE:  Afebrile overnight with no acute events. She did refuse an earlier dose of Bactrim due to nausea. Has several questions regarding her medication.   No Known Allergies   Review of Systems: Review of Systems  Constitutional: Negative for chills, fever and weight loss.  Respiratory: Negative for cough, shortness of breath and wheezing.   Cardiovascular: Negative for chest pain and leg swelling.  Gastrointestinal: Positive for nausea. Negative for abdominal pain, constipation, diarrhea and vomiting.  Skin: Negative for rash.      OBJECTIVE: Vitals:   09/18/19 0002 09/18/19 0355 09/18/19 0528 09/18/19 0759  BP:   113/88 126/85  Pulse: (!) 101  (!) 101 100  Resp: (!) 23  15 (!) 29  Temp:  98.2 F (36.8 C) 99 F (37.2 C) 97.7 F (36.5 C)  TempSrc:  Oral Oral Oral  SpO2: 100%  100% 100%  Weight:  65 kg    Height:       Body mass index is 26.21 kg/m.  Physical Exam Constitutional:      General: She is not in acute distress.    Appearance: She is well-developed.     Comments: Seated in the chair; pleasant.   Cardiovascular:     Rate and Rhythm: Normal rate and regular rhythm.     Heart sounds: Normal heart sounds.  Pulmonary:     Effort: Pulmonary effort is normal.     Breath sounds: Normal breath sounds.  Skin:    General: Skin is warm and dry.  Neurological:     Mental Status: She is alert and oriented to person, place, and time.  Psychiatric:        Behavior: Behavior normal.        Thought Content: Thought content normal.        Judgment: Judgment normal.     Lab Results Lab Results  Component Value Date   WBC 13.5 (H) 09/17/2019   HGB 9.1 (L) 09/17/2019   HCT 29.0 (L) 09/17/2019   MCV 86.3 09/17/2019  PLT 451 (H) 09/17/2019    Lab Results  Component Value Date   CREATININE 0.79 09/17/2019   BUN 14 09/17/2019   NA 137 09/17/2019   K 3.4 (L) 09/17/2019   CL 104 09/17/2019   CO2 23 09/17/2019    Lab Results  Component Value Date   ALT 33 09/17/2019   AST 20 09/17/2019   ALKPHOS 49 09/17/2019   BILITOT 0.3 09/17/2019     Microbiology: Recent Results (from the past 240 hour(s))  Respiratory Panel by PCR     Status: None   Collection Time: 09/08/19 11:43 AM   Specimen: Nasopharyngeal Swab; Respiratory  Result Value Ref Range Status   Adenovirus NOT DETECTED NOT DETECTED Final   Coronavirus 229E NOT DETECTED NOT DETECTED Final    Comment: (NOTE) The Coronavirus on the Respiratory Panel, DOES NOT test for the novel  Coronavirus (2019 nCoV)    Coronavirus HKU1 NOT DETECTED NOT DETECTED Final    Coronavirus NL63 NOT DETECTED NOT DETECTED Final   Coronavirus OC43 NOT DETECTED NOT DETECTED Final   Metapneumovirus NOT DETECTED NOT DETECTED Final   Rhinovirus / Enterovirus NOT DETECTED NOT DETECTED Final   Influenza A NOT DETECTED NOT DETECTED Final   Influenza B NOT DETECTED NOT DETECTED Final   Parainfluenza Virus 1 NOT DETECTED NOT DETECTED Final   Parainfluenza Virus 2 NOT DETECTED NOT DETECTED Final   Parainfluenza Virus 3 NOT DETECTED NOT DETECTED Final   Parainfluenza Virus 4 NOT DETECTED NOT DETECTED Final   Respiratory Syncytial Virus NOT DETECTED NOT DETECTED Final   Bordetella pertussis NOT DETECTED NOT DETECTED Final   Chlamydophila pneumoniae NOT DETECTED NOT DETECTED Final   Mycoplasma pneumoniae NOT DETECTED NOT DETECTED Final    Comment: Performed at [facility], [place]. [address], [place], [place] [address]  MRSA PCR Screening     Status: None   Collection Time: 09/13/19  3:54 AM   Specimen: Nasopharyngeal  Result Value Ref Range Status   MRSA by PCR NEGATIVE NEGATIVE Final    Comment:        The GeneXpert MRSA Assay (FDA approved for NASAL specimens only), is one component of a comprehensive MRSA colonization surveillance program. It is not intended to diagnose MRSA infection nor to guide or monitor treatment for MRSA infections. Performed at [facility], [place] [address], [place], [place] [address]      Terri Piedra, [place] for [facility] [phone] Pager  09/18/2019  11:07 AM

## 2019-09-18 NOTE — Progress Notes (Signed)
PROGRESS NOTE    Deborah Durham    Code Status: Full Code  ZC:[id number] DOB: [birth year] DOA: 09/06/2019  PCP: Patient, No Pcp Per    Hospital Summary  This is a 49 year old female without a significant past medical history who presented to the hospital with 2 weeks of worsening shortness of breath, subjective fevers and decreased appetite with DOE, multiple negative COVID-19 tests prior to admission as well as on admission.  Negative viral respiratory panel.  Initially on CAP treatment. she was diagnosed with HIV this admission and multifocal pneumonia on CT scan concerning for PJP.  ID and pulmonology have been consulted this admission.  Was on Solu-Medrol 40 mg IV twice daily as well as Bactrim IV and started on Biktarvy.  She has since been requiring O2 via nasal cannula with improving O2 requirements.  PT/OT recommending CIR however disposition is pending her improvement and possible discharge home in the coming days versus CIR.  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) Active Problems:   Normocytic anemia   HIV disease ([place])   Status post cholecystectomy   Acute hypoxemic respiratory failure (HCC)   Nausea & vomiting   Transaminitis   Visual hallucinations   1. Acute hypoxic respiratory failure secondary to suspected pneumocystis pneumonia in setting of newly diagnosed HIV a. Currently requiring O2 via 3 L/min nasal cannula b. Due to nausea/vomiting patient has been resistant to taking medications.  Per ID to schedule Phenergan to improve compliance with oral Bactrim and Biktarvy c. Recommend Phenergan 30 minutes prior to p.o. Bactrim and Biktarvy 2. Newly diagnosed HIV a. CD4<35 on 09/09/2019 b. Currently on prednisone, Bactrim p.o. and Biktarvy p.o. c. Recommendations per ID as above 3. Trace pericardial effusion a. Outpatient follow-up 4. Elevated LFTs resolved 5. Nausea and vomiting, medication versus gastritis induced a. Phenergan  scheduled given 30 minutes prior to Bactrim and Biktarvy b. Continue to encourage p.o. intake c. Continue Maalox, Carafate and MiraLAX for now 6. Anxiety due to fear of shortness of breath on ambulation a. Reassurance given 7. Hypokalemia replete IV as patient has poor p.o. intake currently 8. Hypophosphatemia discontinue K-Phos as normalized 9. Hypomagnesemia replete  DVT prophylaxis: Lovenox Family Communication: No family at bedside Disposition Plan: Barrier to discharge is p.o. intake as well as dispo to CIR versus home with home health.  Continue to follow with PT/OT  Consultants  ID Continue PT/OT  Procedures  None  Antibiotics   Anti-infectives (From admission, onward)   Start     Dose/Rate Route Frequency Ordered Stop   09/18/19 1400  sulfamethoxazole-trimethoprim (BACTRIM DS) 800-160 MG per tablet 2 tablet     2 tablet Oral Every 8 hours 09/18/19 1036     09/17/19 1400  sulfamethoxazole-trimethoprim (BACTRIM) 400 mg of trimethoprim in dextrose 5 % 500 mL IVPB  Status:  Discontinued     400 mg of trimethoprim 350 mL/hr over 90 Minutes Intravenous Every 8 hours 09/17/19 1019 09/18/19 1036   09/16/19 1400  sulfamethoxazole-trimethoprim (BACTRIM DS) 800-160 MG per tablet 2 tablet  Status:  Discontinued     2 tablet Oral Every 8 hours 09/16/19 1057 09/17/19 1019   09/09/19 1630  bictegravir-emtricitabine-tenofovir AF (BIKTARVY) 50-200-25 MG per tablet 1 tablet     1 tablet Oral Daily 09/09/19 1534     09/09/19 0900  sulfamethoxazole-trimethoprim (BACTRIM) 400 mg of trimethoprim in dextrose 5 % 500 mL IVPB  Status:  Discontinued     400 mg of  trimethoprim 350 mL/hr over 90 Minutes Intravenous Every 8 hours 09/09/19 0852 09/16/19 1057   09/08/19 1215  sulfamethoxazole-trimethoprim (BACTRIM) 400-80 MG per tablet 1 tablet  Status:  Discontinued     1 tablet Oral Every 12 hours 09/08/19 1206 09/09/19 0852   09/07/19 2200  azithromycin (ZITHROMAX) 500 mg in sodium chloride 0.9 %  250 mL IVPB     500 mg 250 mL/hr over 60 Minutes Intravenous Every 24 hours 09/07/19 0307 09/09/19 0020   09/07/19 2200  cefTRIAXone (ROCEPHIN) 1 g in sodium chloride 0.9 % 100 mL IVPB  Status:  Discontinued     1 g 200 mL/hr over 30 Minutes Intravenous Every 24 hours 09/07/19 0307 09/09/19 1534   09/06/19 2200  cefTRIAXone (ROCEPHIN) 1 g in sodium chloride 0.9 % 100 mL IVPB     1 g 200 mL/hr over 30 Minutes Intravenous  Once 09/06/19 2133 09/07/19 0045   09/06/19 2200  azithromycin (ZITHROMAX) 500 mg in sodium chloride 0.9 % 250 mL IVPB     500 mg 250 mL/hr over 60 Minutes Intravenous  Once 09/06/19 2133 09/07/19 0157           Subjective   Seen and examined sitting upright in chair with nasal cannula.  Per patient she has some anxiety on ambulation she gets nervous that she will become short of breath when ambulating.  States she still has nausea and vomiting most recently last night.  She is nervous to eat as she does not want to vomit on which medication gives her the symptoms.  Otherwise denies any chest pain, nausea, vomiting, diarrhea, constipation.  No other issues.  Objective   Vitals:   09/18/19 0355 09/18/19 0528 09/18/19 0759 09/18/19 1122  BP:  113/88 126/85 115/85  Pulse:  (!) 101 100 85  Resp:  15 (!) 29 (!) 25  Temp: 98.2 F (36.8 C) 99 F (37.2 C) 97.7 F (36.5 C) 98.3 F (36.8 C)  TempSrc: Oral Oral Oral Oral  SpO2:  100% 100% 100%  Weight: 65 kg     Height:        Intake/Output Summary (Last 24 hours) at 09/18/2019 1642 Last data filed at 09/18/2019 0600 Gross per 24 hour  Intake 500 ml  Output --  Net 500 ml   Filed Weights   09/06/19 1552 09/18/19 0355  Weight: 59.9 kg 65 kg    Examination:  Physical Exam Vitals and nursing note reviewed.  Constitutional:      Appearance: She is well-developed. She is not ill-appearing.  HENT:     Head: Normocephalic.  Cardiovascular:     Rate and Rhythm: Regular rhythm. Tachycardia present.   Pulmonary:     Effort: Pulmonary effort is normal. No tachypnea.     Breath sounds: No wheezing.     Comments: Requiring 3 L/min nasal cannula Musculoskeletal:     Cervical back: Normal range of motion.     Right lower leg: No edema.     Left lower leg: No edema.  Neurological:     General: No focal deficit present.     Mental Status: She is alert.  Psychiatric:        Mood and Affect: Mood is anxious.        Behavior: Behavior normal.     Data Reviewed: I have personally reviewed following labs and imaging studies  CBC: Recent Labs  Lab 09/14/19 1734 09/15/19 0548 09/16/19 0248 09/17/19 1011 09/18/19 1210  WBC 12.9* 10.5 8.2 13.5*  5.9  HGB 10.2* 10.2* 9.9* 9.1* 9.2*  HCT 30.9* 31.1* 30.1* 29.0* 29.2*  MCV 83.5 82.9 83.4 86.3 85.9  PLT 466* 465* 462* 451* [id number]   Basic Metabolic Panel: Recent Labs  Lab 09/14/19 1734 09/15/19 0548 09/16/19 0248 09/17/19 1011 09/18/19 1210  NA 130* 135 134* 137 136  K 4.3 4.5 4.3 3.4* 3.3*  CL 96* 98 100 104 101  CO2 20* 24 25 23 24   GLUCOSE 179* 78 79 133* 142*  BUN 8 8 9 14 6   CREATININE 0.69 0.57 0.68 0.79 0.72  CALCIUM 8.8* 9.0 9.0 8.7* 8.5*  MG 1.9 2.0 2.0 1.7 1.7  PHOS 3.4 4.0 4.2 2.2* 3.1   GFR: Estimated Creatinine Clearance: 76.2 mL/min (by C-G formula based on SCr of 0.72 mg/dL). Liver Function Tests: Recent Labs  Lab 09/14/19 1734 09/15/19 0548 09/16/19 0248 09/17/19 1011 09/18/19 1210  AST 38 28 25 20 20   ALT 63* 52* 48* 33 28  ALKPHOS 60 54 53 49 49  BILITOT 0.2* 0.5 0.4 0.3 0.3  PROT 7.1 6.8 6.6 6.1* 5.9*  ALBUMIN 2.4* 2.4* 2.5* 2.3* 2.2*   No results for input(s): LIPASE, AMYLASE in the last 168 hours. No results for input(s): AMMONIA in the last 168 hours. Coagulation Profile: No results for input(s): INR, PROTIME in the last 168 hours. Cardiac Enzymes: No results for input(s): CKTOTAL, CKMB, CKMBINDEX, TROPONINI in the last 168 hours. BNP (last 3 results) No results for input(s): PROBNP in the  last 8760 hours. HbA1C: No results for input(s): HGBA1C in the last 72 hours. CBG: No results for input(s): GLUCAP in the last 168 hours. Lipid Profile: No results for input(s): CHOL, HDL, LDLCALC, TRIG, CHOLHDL, LDLDIRECT in the last 72 hours. Thyroid Function Tests: No results for input(s): TSH, T4TOTAL, FREET4, T3FREE, THYROIDAB in the last 72 hours. Anemia Panel: No results for input(s): VITAMINB12, FOLATE, FERRITIN, TIBC, IRON, RETICCTPCT in the last 72 hours. Sepsis Labs: No results for input(s): PROCALCITON, LATICACIDVEN in the last 168 hours.  Recent Results (from the past 240 hour(s))  MRSA PCR Screening     Status: None   Collection Time: 09/13/19  3:54 AM   Specimen: Nasopharyngeal  Result Value Ref Range Status   MRSA by PCR NEGATIVE NEGATIVE Final    Comment:        The GeneXpert MRSA Assay (FDA approved for NASAL specimens only), is one component of a comprehensive MRSA colonization surveillance program. It is not intended to diagnose MRSA infection nor to guide or monitor treatment for MRSA infections. Performed at [facility], [place] [address] [place], [place] [address]          Radiology Studies: No results found.      Scheduled Meds: . bictegravir-emtricitabine-tenofovir AF  1 tablet Oral Daily  . enoxaparin (LOVENOX) injection  40 mg Subcutaneous Daily  . guaiFENesin  600 mg Oral BID  . pantoprazole (PROTONIX) IV  40 mg Intravenous Q24H  . phosphorus  500 mg Oral TID WC & HS  . polyethylene glycol  17 g Oral Daily  . promethazine  25 mg Intravenous Q8H  . sodium chloride flush  10-40 mL Intracatheter Q12H  . sucralfate  1 g Oral TID WC & HS  . sulfamethoxazole-trimethoprim  2 tablet Oral Q8H   Continuous Infusions:   LOS: 12 days    Time spent: 26 minutes with over 50% of the time coordinating the patient's care    Harold Hedge, DO [facility]  Pager [phone]  If 7PM-7AM, please contact  night-coverage [email or web address] Password [facility] 09/18/2019, 4:42 PM

## 2019-09-18 NOTE — Progress Notes (Signed)
Occupational Therapy Treatment Patient Details Name: Deborah Durham MRN: [id number] DOB: October 27, 1970 Today's Date: 09/18/2019    History of present illness Pt is a 49 y.o. female admitted 09/06/19 for 2-week history of SOB, fevers, and decreased appetite. 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 No significant PMH.   OT comments  Patient progressing well, limited by anxiety and decreased activity tolerance.  She is completing transfers with min assist using RW, in room mobility with min guard assist.  Requires cueing for PLB due to anxiety throughout session, RR ranged from 15-45 and SpO2 maintained on 5L via [place].  Initiated education on energy conservation techniques, pt verbalizes understanding.  Will follow acutely. DC plan remains appropriate at CIR.    Follow Up Recommendations  CIR    Equipment Recommendations  3 in 1 bedside commode    Recommendations for Other Services      Precautions / Restrictions Precautions Precautions: Fall Restrictions Weight Bearing Restrictions: No       Mobility Bed Mobility               General bed mobility comments: in recliner upon entry   Transfers Overall transfer level: Needs assistance Equipment used: Rolling walker (2 wheeled) Transfers: Sit to/from Stand Sit to Stand: Min assist         General transfer comment: min assist for steadying, cueing for hand placement and safety during transfers    Balance Overall balance assessment: Needs assistance Sitting-balance support: Feet unsupported;Single extremity supported Sitting balance-Leahy Scale: Good     Standing balance support: Bilateral upper extremity supported;During functional activity Standing balance-Leahy Scale: Fair Standing balance comment: relaint on RW dynamically                           ADL either performed or assessed with clinical judgement   ADL Overall ADL's : Needs assistance/impaired                          Toilet Transfer: Minimal assistance;Ambulation;RW Toilet Transfer Details (indicate cue type and reason): simulated to/from recliner          Functional mobility during ADLs: Minimal assistance;Rolling walker General ADL Comments: reivewed energy conservation techniques for use during daily routines, RR up to 45 during activity on 5L [place] SpO2 100%     Vision   Vision Assessment?: No apparent visual deficits   Perception     Praxis      Cognition Arousal/Alertness: Awake/alert Behavior During Therapy: Anxious Overall Cognitive Status: Within Functional Limits for tasks assessed                                 General Comments: reinforced PLB techniques for anxiety, good awarenes but requires cueing for PLB during moblity        Exercises     Shoulder Instructions       General Comments reviewed energy conservation techniques and PLB for decreased RR; seated able to decrease RR to 15, but during activity ranged from 25-45     Pertinent Vitals/ Pain       Pain Assessment: No/denies pain  Home Living  Prior Functioning/Environment              Frequency  Min 2X/week        Progress Toward Goals  OT Goals(current goals can now be found in the care plan section)  Progress towards OT goals: Progressing toward goals  Acute Rehab OT Goals Patient Stated Goal: to gain control of breathing/anxiety;be able to sit in recliner for meals;walk to the door OT Goal Formulation: With patient  Plan Discharge plan remains appropriate;Frequency remains appropriate    Co-evaluation                 AM-PAC OT "6 Clicks" Daily Activity     Outcome Measure   Help from another person eating meals?: None Help from another person taking care of personal grooming?: A Little Help from another person toileting, which includes using toliet, bedpan, or urinal?: A Little Help from another person  bathing (including washing, rinsing, drying)?: A Little Help from another person to put on and taking off regular upper body clothing?: A Little Help from another person to put on and taking off regular lower body clothing?: A Little 6 Click Score: 19    End of Session Equipment Utilized During Treatment: Rolling walker;Oxygen(5L)  OT Visit Diagnosis: Unsteadiness on feet (R26.81);Other abnormalities of gait and mobility (R26.89);Muscle weakness (generalized) (M62.81)   Activity Tolerance Patient tolerated treatment well   Patient Left in chair;with call bell/phone within reach;with nursing/sitter in room   Nurse Communication Mobility status        Time: [id number] OT Time Calculation (min): 28 min  Charges: OT General Charges $OT Visit: 1 Visit OT Treatments $Self Care/Home Management : 23-37 mins  [place] Pager [phone] Office [phone]    Delight Stare 09/18/2019, 10:51 AM

## 2019-09-18 NOTE — Progress Notes (Signed)
Physical Therapy Treatment Patient Details Name: Deborah Durham MRN: [id number] DOB: 22-Mar-1971 Today's Date: 09/18/2019    History of Present Illness Pt is a 49 y.o. female admitted 09/06/19 for 2-week history of SOB, fevers, and decreased appetite. 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 No significant PMH.    PT Comments    Pt much improved today. Discussed deep breathing and other anxiety management techniques. Pts SPO2 >97% on 6LO2 via [place] while ambulating. Due to anxiety and fear of not being able to breath again pts RR in 40s however when pt is engaged in conversation with PT while ambulating RR in 1s. Pt using RW at this time for energy conservation to build endurance. Acute PT to cont to follow.   Follow Up Recommendations  CIR (pt able to ambulate 130' today, if con't to progress then could potentially go home with 24/7 assist, HHPT, oxygen and RW.     Equipment Recommendations    Rolling walker   Recommendations for Other Services Rehab consult     Precautions / Restrictions Precautions Precautions: Fall Restrictions Weight Bearing Restrictions: No    Mobility  Bed Mobility Overal bed mobility: Needs Assistance Bed Mobility: Supine to Sit     Supine to sit: Supervision     General bed mobility comments: increased time, no physical assist required, RR inc to 40s, verbal cues to complete purse lipped breathing  Transfers Overall transfer level: Needs assistance Equipment used: Rolling walker (2 wheeled) Transfers: Sit to/from Stand Sit to Stand: Min assist         General transfer comment: minA to steady, verbal cues to push up from bed not pull upon walker, verbal cues for deep breaths  Ambulation/Gait Ambulation/Gait assistance: Min guard   Assistive device: Rolling walker (2 wheeled) Gait Pattern/deviations: Step-through pattern;Decreased stride length Gait velocity: slow Gait velocity interpretation: <1.31 ft/sec, indicative of  household ambulator General Gait Details: slow, guarded, pt anxious regarding onset of another episode of "not being able to breathe" Pt requiring constant reassurance her vitals are stable. Pt's HR remained 110-120, RR in 40s however when engaging pt in conversation while walking RR dec into 20s. RW utilized today for support and energy conservation. Pt SpO2 >98% on 6LO2 via [place].    Stairs             Wheelchair Mobility    Modified Rankin (Stroke Patients Only)       Balance Overall balance assessment: Needs assistance Sitting-balance support: Feet supported;No upper extremity supported Sitting balance-Leahy Scale: Good Sitting balance - Comments: able to don socks while EOB without difficulty   Standing balance support: Bilateral upper extremity supported;During functional activity Standing balance-Leahy Scale: Fair Standing balance comment: relaint on RW dynamically                            Cognition Arousal/Alertness: Awake/alert Behavior During Therapy: Anxious Overall Cognitive Status: Within Functional Limits for tasks assessed                                 General Comments: discussed relaxation breathing, verbal cues on reassurance that vitals were stable      Exercises      General Comments General comments (skin integrity, edema, etc.): SpO2 >97% on 6LO2 via [place]      Pertinent Vitals/Pain Pain Assessment: No/denies pain    Home Living  Prior Function            PT Goals (current goals can now be found in the care plan section) Acute Rehab PT Goals Patient Stated Goal: move forward, get rid of nausea Progress towards PT goals: Progressing toward goals    Frequency    Min 3X/week      PT Plan Current plan remains appropriate    Co-evaluation              AM-PAC PT "6 Clicks" Mobility   Outcome Measure  Help needed turning from your back to your side while in a flat bed  without using bedrails?: None Help needed moving from lying on your back to sitting on the side of a flat bed without using bedrails?: A Little Help needed moving to and from a bed to a chair (including a wheelchair)?: A Little Help needed standing up from a chair using your arms (e.g., wheelchair or bedside chair)?: A Little Help needed to walk in hospital room?: A Little Help needed climbing 3-5 steps with a railing? : A Lot 6 Click Score: 18    End of Session Equipment Utilized During Treatment: Oxygen Activity Tolerance: Patient tolerated treatment well Patient left: in chair;with call bell/phone within reach;with chair alarm set Nurse Communication: Mobility status PT Visit Diagnosis: Unsteadiness on feet (R26.81);Other abnormalities of gait and mobility (R26.89);Muscle weakness (generalized) (M62.81)     Time: 0915-1000 PT Time Calculation (min) (ACUTE ONLY): 45 min  Charges:  $Gait Training: 23-37 mins $Therapeutic Activity: 8-22 mins                     Kittie Plater, PT, DPT Acute Rehabilitation Services Pager #: [phone] Office #: [phone]    Berline Lopes 09/18/2019, 11:31 AM

## 2019-09-19 DIAGNOSIS — R400 Somnolence: Secondary | ICD-10-CM

## 2019-09-19 LAB — BASIC METABOLIC PANEL
Anion gap: 8 (ref 5–15)
BUN: 7 mg/dL (ref 6–20)
CO2: 23 mmol/L (ref 22–32)
Calcium: 8.1 mg/dL — ABNORMAL LOW (ref 8.9–10.3)
Chloride: 103 mmol/L (ref 98–111)
Creatinine, Ser: 0.67 mg/dL (ref 0.44–1.00)
GFR calc Af Amer: >60 mL/min (ref >60)
GFR calc non Af Amer: >60 mL/min (ref >60)
Glucose, Bld: 99 mg/dL (ref 70–99)
Potassium: 3.9 mmol/L (ref 3.5–5.1)
Sodium: 134 mmol/L — ABNORMAL LOW (ref 135–145)

## 2019-09-19 LAB — CBC
HCT: 27.3 % — ABNORMAL LOW (ref 36.0–46.0)
Hemoglobin: 8.7 g/dL — ABNORMAL LOW (ref 12.0–15.0)
MCH: 27.2 pg (ref 26.0–34.0)
MCHC: 31.9 g/dL (ref 30.0–36.0)
MCV: 85.3 fL (ref 80.0–100.0)
Platelets: 327 10*3/uL (ref 150–400)
RBC: 3.2 MIL/uL — ABNORMAL LOW (ref 3.87–5.11)
RDW: 15.4 % (ref 11.5–15.5)
WBC: 6.2 10*3/uL (ref 4.0–10.5)
nRBC: 0 % (ref 0.0–0.2)

## 2019-09-19 LAB — MAGNESIUM: Magnesium: 2.1 mg/dL (ref 1.7–2.4)

## 2019-09-19 MED ORDER — PROMETHAZINE HCL 25 MG/ML IJ SOLN
12.5000 mg | Freq: Once | INTRAMUSCULAR | Status: AC
  Administered 2019-09-19: 12.5 mg via INTRAVENOUS

## 2019-09-19 MED ORDER — PROMETHAZINE HCL 25 MG/ML IJ SOLN
25.0000 mg | Freq: Every day | INTRAMUSCULAR | Status: DC
  Filled 2019-09-19: qty 1

## 2019-09-19 MED ORDER — PROMETHAZINE HCL 25 MG/ML IJ SOLN
12.5000 mg | Freq: Two times a day (BID) | INTRAMUSCULAR | Status: DC
  Administered 2019-09-19 – 2019-09-20 (×3): 12.5 mg via INTRAVENOUS
  Filled 2019-09-19 (×3): qty 1

## 2019-09-19 NOTE — Progress Notes (Signed)
PROGRESS NOTE    Deborah Durham    Code Status: Full Code  [id number] DOB: 08-19-1971 DOA: 09/06/2019  PCP: Patient, No Pcp Per    Hospital Summary  This is a 49 year old female without a significant past medical history who presented to the hospital with 2 weeks of worsening shortness of breath, subjective fevers and decreased appetite with DOE, multiple negative COVID-19 tests prior to admission as well as on admission.  Negative viral respiratory panel.  Initially on CAP treatment. she was diagnosed with HIV this admission and multifocal pneumonia on CT scan concerning for PJP.  ID and pulmonology have been consulted this admission.  Was on Solu-Medrol 40 mg IV twice daily as well as Bactrim IV and started on Biktarvy.  She has since been requiring O2 via nasal cannula with improving O2 requirements.  PT/OT recommending CIR however disposition is pending her improvement and possible discharge home in the coming days versus CIR.  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 Acute hypoxic respiratory failure secondary to suspected pneumocystis pneumonia in setting of newly diagnosed HIV a. Currently requiring O2 via 3 L/min nasal cannula b. Due to nausea/vomiting patient has been resistant to taking medications, now doing much better with scheduled Phenergan  c. Recommend Phenergan 30 minutes prior to p.o. Bactrim and Biktarvy d. Possible discharge home in next 24 hours.  Will need ambulatory pulse ox at that time and likely O2 at discharge 2. Newly diagnosed HIV a. CD4<35 on 09/09/2019 b. Currently on Bactrim p.o. and Biktarvy p.o., off prednisone c. Recommendations per ID as above 3. Trace pericardial effusion a. Outpatient follow-up 4. Elevated LFTs  resolved 5. Nausea and vomiting, medication versus gastritis induced a. Phenergan scheduled given 30 minutes prior to Bactrim and Biktarvy b. Continue to encourage p.o. intake c. Continue Maalox, Carafate and MiraLAX for now 6. Anxiety due to fear of shortness of breath on ambulation Reassurance given 7. Hypokalemia resolved 8. Hypophosphatemia resolved 9. Hypomagnesemia resolved  DVT prophylaxis: Lovenox Family Communication: Patient wishes to update family herself Disposition Plan: Patient now candidate for discharge to home with home health.  If she is able to tolerate p.o. intake over the next 24 hours she can likely be discharged home tomorrow  Consultants  ID Continue PT/OT  Procedures  None  Antibiotics   Anti-infectives (From admission, onward)   Start     Dose/Rate Route Frequency Ordered Stop   09/18/19 1400  sulfamethoxazole-trimethoprim (BACTRIM DS) 800-160 MG per tablet 2 tablet     2 tablet Oral Every 8 hours 09/18/19 1036     09/17/19 1400  sulfamethoxazole-trimethoprim (BACTRIM) 400 mg of trimethoprim in dextrose 5 % 500 mL IVPB  Status:  Discontinued     400 mg of trimethoprim 350 mL/hr over 90 Minutes Intravenous Every 8 hours 09/17/19 1019 09/18/19 1036   09/16/19 1400  sulfamethoxazole-trimethoprim (BACTRIM DS) 800-160 MG per tablet 2 tablet  Status:  Discontinued     2 tablet Oral Every 8 hours 09/16/19 1057 09/17/19 1019   09/09/19 1630  bictegravir-emtricitabine-tenofovir AF (BIKTARVY) 50-200-25 MG per tablet 1 tablet     1 tablet Oral Daily 09/09/19 1534     09/09/19 0900  sulfamethoxazole-trimethoprim (BACTRIM) 400 mg of trimethoprim in dextrose 5 %  500 mL IVPB  Status:  Discontinued     400 mg of trimethoprim 350 mL/hr over 90 Minutes Intravenous Every 8 hours 09/09/19 0852 09/16/19 1057   09/08/19 1215  sulfamethoxazole-trimethoprim (BACTRIM) 400-80 MG per tablet 1 tablet  Status:  Discontinued     1 tablet Oral Every 12 hours 09/08/19 1206 09/09/19  0852   09/07/19 2200  azithromycin (ZITHROMAX) 500 mg in sodium chloride 0.9 % 250 mL IVPB     500 mg 250 mL/hr over 60 Minutes Intravenous Every 24 hours 09/07/19 0307 09/09/19 0020   09/07/19 2200  cefTRIAXone (ROCEPHIN) 1 g in sodium chloride 0.9 % 100 mL IVPB  Status:  Discontinued     1 g 200 mL/hr over 30 Minutes Intravenous Every 24 hours 09/07/19 0307 09/09/19 1534   09/06/19 2200  cefTRIAXone (ROCEPHIN) 1 g in sodium chloride 0.9 % 100 mL IVPB     1 g 200 mL/hr over 30 Minutes Intravenous  Once 09/06/19 2133 09/07/19 0045   09/06/19 2200  azithromycin (ZITHROMAX) 500 mg in sodium chloride 0.9 % 250 mL IVPB     500 mg 250 mL/hr over 60 Minutes Intravenous  Once 09/06/19 2133 09/07/19 0157           Subjective   States she is feeling much better today and has not had any nausea since starting scheduled Phenergan and has been able to tolerate her p.o. medications.  Has been able to tolerate diet.  Also ambulating well.  No events overnight.  No current complaints. Objective   Vitals:   09/19/19 0459 09/19/19 0613 09/19/19 0619 09/19/19 0625  BP: 105/73  105/78   Pulse:  79 84 100  Resp: 15 15 (!) 26 19  Temp: 97.9 F (36.6 C)  98.4 F (36.9 C)   TempSrc: Oral     SpO2:  100% 100% 100%  Weight:      Height:        Intake/Output Summary (Last 24 hours) at 09/19/2019 0710 Last data filed at 09/19/2019 0100 Gross per 24 hour  Intake 200 ml  Output --  Net 200 ml   Filed Weights   09/06/19 1552 09/18/19 0355  Weight: 59.9 kg 65 kg    Examination:  Physical Exam Vitals and nursing note reviewed.  Constitutional:      Appearance: Normal appearance.  HENT:     Head: Normocephalic and atraumatic.     Nose: Nose normal.     Mouth/Throat:     Mouth: Mucous membranes are moist.  Eyes:     Extraocular Movements: Extraocular movements intact.  Cardiovascular:     Rate and Rhythm: Normal rate and regular rhythm.  Pulmonary:     Effort: Pulmonary effort is  normal.     Breath sounds: Normal breath sounds.     Comments: 3 L nasal cannula Abdominal:     General: Abdomen is flat.     Palpations: Abdomen is soft.  Musculoskeletal:        General: No swelling. Normal range of motion.     Cervical back: Normal range of motion. No rigidity.  Neurological:     General: No focal deficit present.     Mental Status: She is alert. Mental status is at baseline.  Psychiatric:        Mood and Affect: Mood normal.        Behavior: Behavior normal.     Data Reviewed: I have personally reviewed following labs and imaging studies  CBC:  Recent Labs  Lab 09/15/19 0548 09/16/19 0248 09/17/19 1011 09/18/19 1210 09/19/19 0241  WBC 10.5 8.2 13.5* 5.9 6.2  HGB 10.2* 9.9* 9.1* 9.2* 8.7*  HCT 31.1* 30.1* 29.0* 29.2* 27.3*  MCV 82.9 83.4 86.3 85.9 85.3  PLT 465* 462* 451* 365 [id number]   Basic Metabolic Panel: Recent Labs  Lab 09/14/19 1734 09/14/19 1734 09/15/19 0548 09/16/19 0248 09/17/19 1011 09/18/19 1210 09/19/19 0241  NA 130*   < > 135 134* 137 136 134*  K 4.3   < > 4.5 4.3 3.4* 3.3* 3.9  CL 96*   < > 98 100 104 101 103  CO2 20*   < > 24 25 23 24 23   GLUCOSE 179*   < > 78 79 133* 142* 99  BUN 8   < > 8 9 14 6 7   CREATININE 0.69   < > 0.57 0.68 0.79 0.72 0.67  CALCIUM 8.8*   < > 9.0 9.0 8.7* 8.5* 8.1*  MG 1.9   < > 2.0 2.0 1.7 1.7 2.1  PHOS 3.4  --  4.0 4.2 2.2* 3.1  --    < > = values in this interval not displayed.   GFR: Estimated Creatinine Clearance: 76.2 mL/min (by C-G formula based on SCr of 0.67 mg/dL). Liver Function Tests: Recent Labs  Lab 09/14/19 1734 09/15/19 0548 09/16/19 0248 09/17/19 1011 09/18/19 1210  AST 38 28 25 20 20   ALT 63* 52* 48* 33 28  ALKPHOS 60 54 53 49 49  BILITOT 0.2* 0.5 0.4 0.3 0.3  PROT 7.1 6.8 6.6 6.1* 5.9*  ALBUMIN 2.4* 2.4* 2.5* 2.3* 2.2*   No results for input(s): LIPASE, AMYLASE in the last 168 hours. No results for input(s): AMMONIA in the last 168 hours. Coagulation Profile: No  results for input(s): INR, PROTIME in the last 168 hours. Cardiac Enzymes: No results for input(s): CKTOTAL, CKMB, CKMBINDEX, TROPONINI in the last 168 hours. BNP (last 3 results) No results for input(s): PROBNP in the last 8760 hours. HbA1C: No results for input(s): HGBA1C in the last 72 hours. CBG: No results for input(s): GLUCAP in the last 168 hours. Lipid Profile: No results for input(s): CHOL, HDL, LDLCALC, TRIG, CHOLHDL, LDLDIRECT in the last 72 hours. Thyroid Function Tests: No results for input(s): TSH, T4TOTAL, FREET4, T3FREE, THYROIDAB in the last 72 hours. Anemia Panel: No results for input(s): VITAMINB12, FOLATE, FERRITIN, TIBC, IRON, RETICCTPCT in the last 72 hours. Sepsis Labs: No results for input(s): PROCALCITON, LATICACIDVEN in the last 168 hours.  Recent Results (from the past 240 hour(s))  MRSA PCR Screening     Status: None   Collection Time: 09/13/19  3:54 AM   Specimen: Nasopharyngeal  Result Value Ref Range Status   MRSA by PCR NEGATIVE NEGATIVE Final    Comment:        The GeneXpert MRSA Assay (FDA approved for NASAL specimens only), is one component of a comprehensive MRSA colonization surveillance program. It is not intended to diagnose MRSA infection nor to guide or monitor treatment for MRSA infections. Performed at [facility], [place] [address], [place], [place] [address]          Radiology Studies: No results found.      Scheduled Meds: . bictegravir-emtricitabine-tenofovir AF  1 tablet Oral Daily  . enoxaparin (LOVENOX) injection  40 mg Subcutaneous Daily  . guaiFENesin  600 mg Oral BID  . pantoprazole (PROTONIX) IV  40 mg Intravenous Q24H  . polyethylene glycol  17  g Oral Daily  . promethazine  25 mg Intravenous Q8H  . sodium chloride flush  10-40 mL Intracatheter Q12H  . sucralfate  1 g Oral TID WC & HS  . sulfamethoxazole-trimethoprim  2 tablet Oral Q8H   Continuous Infusions: . potassium chloride       LOS: 13  days    Time spent: 20 minutes with over 50% of the time coordinating the patient's care    Harold Hedge, DO [facility] Pager [phone]  If 7PM-7AM, please contact night-coverage [email or web address] Password [email or web address] 09/19/2019, 7:10 AM

## 2019-09-19 NOTE — Care Management (Addendum)
Spoke w patient on the phone. She states that she will be going home to stay at her mother's house. [place] She would like a RW for home use, requested this from [facility] today for potential DC in the AM. Requested ambulatory sats for home oxygen. We discussed HH, and she stated she preferred to have OP PT, she is COVID negative, and a referral has been placed through Epic to [facility] OP PT.   Benefit check sent for Biktarvy, will need to verify she is set up with [facility] for follow up.  Will continue to follow for home O2.

## 2019-09-19 NOTE — Progress Notes (Signed)
Inpatient Rehabilitation Admissions Coordinator  I met with patient at bedside. No CIR bed available today for this pt. Pt preparing to d/c home to Mom's home with oxygen and HH follow up. I will follow.  Danne Baxter, RN, MSN Rehab Admissions Coordinator [phone] 09/19/2019 11:41 AM

## 2019-09-19 NOTE — Progress Notes (Signed)
    [place] for Infectious Disease   Reason for visit: Follow up on PJP pneumonia  Interval History: she is now taking the oral medication with the scheduled phenergan.  Sleepy this am. No vomiting and otherwise tolerating.  Remains off of steroids.    Physical Exam: Constitutional:  Vitals:   09/19/19 0625 09/19/19 0800  BP:  (!) 95/59  Pulse: 100 90  Resp: 19 20  Temp:  97.9 F (36.6 C)  SpO2: 100% 100%   patient appears in NAD Eyes: anicteric Respiratory: Normal respiratory effort; CTA B Cardiovascular: RRR GI: soft, nt, nd  Review of Systems: Constitutional: negative for fevers and chills Gastrointestinal: negative for vomiting  Lab Results  Component Value Date   WBC 6.2 09/19/2019   HGB 8.7 (L) 09/19/2019   HCT 27.3 (L) 09/19/2019   MCV 85.3 09/19/2019   PLT 327 09/19/2019    Lab Results  Component Value Date   CREATININE 0.67 09/19/2019   BUN 7 09/19/2019   NA 134 (L) 09/19/2019   K 3.9 09/19/2019   CL 103 09/19/2019   CO2 23 09/19/2019    Lab Results  Component Value Date   ALT 28 09/18/2019   AST 20 09/18/2019   ALKPHOS 49 09/18/2019     Microbiology: Recent Results (from the past 240 hour(s))  MRSA PCR Screening     Status: None   Collection Time: 09/13/19  3:54 AM   Specimen: Nasopharyngeal  Result Value Ref Range Status   MRSA by PCR NEGATIVE NEGATIVE Final    Comment:        The GeneXpert MRSA Assay (FDA approved for NASAL specimens only), is one component of a comprehensive MRSA colonization surveillance program. It is not intended to diagnose MRSA infection nor to guide or monitor treatment for MRSA infections. Performed at [facility], [place] [address], [place], [place] [address]     Impression/Plan:  1. PJP pneumonia - she is now tolerating Bactrim with phenergan premedication so will continue. She started on 1/10 but has had issues with tolerance.  Will continue through 1/30 and can stop then if she is doing  well.   Will continue off of the steroids due to n/v.    2.  HIV/AIDS - she is now tolerating the Biktarvy and I again encouraged her to remain on it.   Will do follow up labs as an outpatient.   3.  Somnolence - will reduce the daytime phenergan to 12.5 and keep it at 25 mg at night.    Ok from [place] standpoint for discharge to CIR if still the plan, though may go home instead.   If she goes home, she just needs to be able to take the oral medication so if she continues to tolerate over the next 24 hours or so, ok for discharge from ID standpoint, +/- home oxygen.

## 2019-09-19 NOTE — Progress Notes (Signed)
Inpatient Rehabilitation Admissions Coordinator  Pt no longer in need of an inpt rehab admit. We will signoff at this time. I have alerted RN CM, Debbie and acute team.  Danne Baxter, RN, MSN Rehab Admissions Coordinator [phone] 09/19/2019 2:53 PM

## 2019-09-19 NOTE — TOC Benefit Eligibility Note (Signed)
Transition of Care [facility]) Benefit Eligibility Note    Patient Details  Name: Deborah Durham MRN: [id number] Date of Birth: [birth year]   Medication/Dose: EMTRICITABINE   50-200-25 TABLET  DAILY  Covered?: Yes  Tier: (NO TIER)  Prescription Coverage Preferred Pharmacy: [facility]  Spoke with Person/Company/Phone Number:: CINDY  @ [employer] # [phone]  Co-Pay: $10.00  Prior Approval: No  Deductible: Met  Additional Notes: TENOFAVIR  300 MG DAILY- CO-PAY-$10.00 , BIKTARVY - $  30.00.   BICLEGRAVIR : NOT [place] Phone Number: 09/19/2019, 5:48 PM

## 2019-09-19 NOTE — Progress Notes (Signed)
Physical Therapy Treatment Patient Details Name: Deborah Durham MRN: [id number] DOB: 1971-06-17 Today's Date: 09/19/2019    History of Present Illness Pt is a 49 y.o. female admitted 09/06/19 for 2-week history of SOB, fevers, and decreased appetite. 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 No significant PMH.   PT Comments    Pt progressing well with mobility. Today's session limited by lethargy secondary to medications. Pt able to mobilize short distance without DME, supervision for safety. O2 titrated down from 5L and SpO2 maintaining 96-100% on 3L O2 [place]. Increased time discussing pt's new discharge plan, now planning to d/c to mother's home with family assist. Educ re: potential to d/c home with O2, energy conservation strategies, activity recommendations.    Follow Up Recommendations  Home health PT;Supervision for mobility/OOB     Equipment Recommendations  (TBD on RW)    Recommendations for Other Services       Precautions / Restrictions Precautions Precautions: Fall Restrictions Weight Bearing Restrictions: No    Mobility  Bed Mobility Overal bed mobility: Independent                Transfers Overall transfer level: Needs assistance Equipment used: None Transfers: Sit to/from Stand Sit to Stand: Supervision         General transfer comment: Increased time and effort  Ambulation/Gait Ambulation/Gait assistance: Supervision Gait Distance (Feet): 4 Feet Assistive device: None Gait Pattern/deviations: Step-through pattern;Decreased stride length;Trunk flexed   Gait velocity interpretation: <1.31 ft/sec, indicative of household ambulator General Gait Details: Slow, guarded steps in room without DME, seated rest in recliner; pt declining additional mobility due to lethargy from meds and wanting to eat breakfast. SpO2 maintaining >/96% on 4L O2 with activity. Post-mobility BP 106/77   Stairs             Wheelchair Mobility     Modified Rankin (Stroke Patients Only)       Balance Overall balance assessment: Needs assistance Sitting-balance support: Feet supported;No upper extremity supported Sitting balance-Leahy Scale: Good     Standing balance support: No upper extremity supported;During functional activity Standing balance-Leahy Scale: Fair                              Cognition Arousal/Alertness: Awake/alert;Lethargic;Suspect due to medications Behavior During Therapy: [facility] for tasks assessed/performed Overall Cognitive Status: Within Functional Limits for tasks assessed                                 General Comments: Lethargy likely related to AM medications, pt aware of this      Exercises      General Comments General comments (skin integrity, edema, etc.): SpO2 100% on 5L, maintaining 96-100% on 4L O2 with activity; pt seated in chair to eat breakfast and O2 reduced to 3L, maintaining 96-100%      Pertinent Vitals/Pain Pain Assessment: No/denies pain    Home Living                      Prior Function            PT Goals (current goals can now be found in the care plan section) Acute Rehab PT Goals Patient Stated Goal: Now planning to d/c to mom's home with family assist PT Goal Formulation: With patient Time For Goal Achievement: 09/27/19 Potential to Achieve Goals:  Good Progress towards PT goals: Progressing toward goals    Frequency    Min 3X/week      PT Plan Discharge plan needs to be updated    Co-evaluation              AM-PAC PT "6 Clicks" Mobility   Outcome Measure  Help needed turning from your back to your side while in a flat bed without using bedrails?: None Help needed moving from lying on your back to sitting on the side of a flat bed without using bedrails?: None Help needed moving to and from a bed to a chair (including a wheelchair)?: None Help needed standing up from a chair using your arms (e.g.,  wheelchair or bedside chair)?: None Help needed to walk in hospital room?: A Little Help needed climbing 3-5 steps with a railing? : A Little 6 Click Score: 22    End of Session Equipment Utilized During Treatment: Oxygen Activity Tolerance: Patient tolerated treatment well;Patient limited by fatigue Patient left: in chair;with call bell/phone within reach Nurse Communication: Mobility status PT Visit Diagnosis: Unsteadiness on feet (R26.81);Other abnormalities of gait and mobility (R26.89);Muscle weakness (generalized) (M62.81)     Time: [id number] PT Time Calculation (min) (ACUTE ONLY): 14 min  Charges:  $Therapeutic Activity: 8-22 mins                    Mabeline Caras, PT, DPT Acute Rehabilitation Services  Pager [phone] Office [place] 09/19/2019, 12:00 PM

## 2019-09-20 MED ORDER — ONDANSETRON HCL 4 MG PO TABS: 4.0000 mg | ORAL_TABLET | Freq: Every day | ORAL | 1 refills | Status: DC | PRN

## 2019-09-20 MED ORDER — SULFAMETHOXAZOLE-TRIMETHOPRIM 800-160 MG PO TABS: 1.0000 | ORAL_TABLET | ORAL | 2 refills | Status: DC

## 2019-09-20 MED ORDER — BICTEGRAVIR-EMTRICITAB-TENOFOV 50-200-25 MG PO TABS: 1.0000 | ORAL_TABLET | Freq: Every day | ORAL | 2 refills | Status: DC

## 2019-09-20 MED ORDER — PROMETHAZINE HCL 12.5 MG PO TABS: 12.5000 mg | ORAL_TABLET | Freq: Three times a day (TID) | ORAL | 0 refills | Status: AC | PRN

## 2019-09-20 MED ORDER — SULFAMETHOXAZOLE-TRIMETHOPRIM 800-160 MG PO TABS: 2.0000 | ORAL_TABLET | Freq: Three times a day (TID) | ORAL | 0 refills | Status: DC

## 2019-09-20 MED ORDER — SULFAMETHOXAZOLE-TRIMETHOPRIM 800-160 MG PO TABS: 2.0000 | ORAL_TABLET | Freq: Three times a day (TID) | ORAL | 0 refills | Status: AC

## 2019-09-20 MED FILL — SULFAMETHOXAZOLE-TMP DS TAB: 800-160 | 7 days supply | Qty: 48 | Fill #0

## 2019-09-20 MED FILL — ONDANSETRON HCL 4 MG TABLET: 4 | 9 days supply | Qty: 9 | Fill #0

## 2019-09-20 MED FILL — PROMETHAZINE 12.5 MG TABLET: 12.5 | 30 days supply | Qty: 30 | Fill #0

## 2019-09-20 MED FILL — SULFAMETHOXAZOLE-TMP DS TAB: 800-160 | 28 days supply | Qty: 12 | Fill #0

## 2019-09-20 MED FILL — BIKTARVY 50-200-25 MG TABS: 50-200-25 | 30 days supply | Qty: 30 | Fill #0

## 2019-09-20 NOTE — Progress Notes (Signed)
Occupational Therapy Treatment Patient Details Name: Deborah Durham MRN: [id number] DOB: 1971-05-24 Today's Date: 09/20/2019    History of present illness Pt is a 49 y.o. female admitted 09/06/19 for 2-week history of SOB, fevers, and decreased appetite. 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 No significant PMH.   OT comments  Pt making good progress towards OT goals this session. Session focus on functional mobility and seated UB/LB dressing and bathing at sink. Overall, pt requires supervision - MIN A for UB/LB dressing and supervision for UB/LB bathing. Updated DC plan to no OT f/u as pt continues to progress with therapies. Will let OTR know about change in POC. Pt on 3L O2 throughout session with O2 >93% during session. Pt likely to DC home with family support later today. Will follow acutely per POC.    Follow Up Recommendations  No OT follow up    Equipment Recommendations  3 in 1 bedside commode    Recommendations for Other Services      Precautions / Restrictions Precautions Precautions: Fall Restrictions Weight Bearing Restrictions: No       Mobility Bed Mobility              General bed mobility comments: sitting EOB upon OT arrival and returned to EOB  Transfers Overall transfer level: Independent Equipment used: None Transfers: Sit to/from Stand Sit to Stand: Supervision         General transfer comment: supervision for safety    Balance Overall balance assessment: Needs assistance Sitting-balance support: Feet supported;No upper extremity supported Sitting balance-Leahy Scale: Good     Standing balance support: No upper extremity supported;During functional activity Standing balance-Leahy Scale: Good Standing balance comment: able to reach out of BOS with no LOB or UE support                           ADL either performed or assessed with clinical judgement   ADL Overall ADL's : Needs assistance/impaired      Grooming: Wash/dry hands;Wash/dry face;Oral care;Applying deodorant;Sitting;Supervision/safety;Set up   Upper Body Bathing: Supervision/ safety;Sitting   Lower Body Bathing: Supervison/ safety;Sit to/from stand   Upper Body Dressing : Minimal assistance;Sitting Upper Body Dressing Details (indicate cue type and reason): assist with hospital gown in relation to lines Lower Body Dressing: Supervision/safety;Sit to/from stand Lower Body Dressing Details (indicate cue type and reason): don socks and underwear Toilet Transfer: Supervision/safety;Ambulation Toilet Transfer Details (indicate cue type and reason): simulated via functional mobility with no AD       Tub/Shower Transfer Details (indicate cue type and reason): education on using 3n1 in walkin shower as well as ECS in relation to showering Functional mobility during ADLs: Supervision/safety General ADL Comments: session focus on functional mobility, seated UB/LB dressing and bathing. Pt on 3L O2 throughout session with O2 >90%. Education provided on ECS in relation to ADLs     Vision Patient Visual Report: No change from baseline Vision Assessment?: No apparent visual deficits   Perception     Praxis      Cognition Arousal/Alertness: Awake/alert Behavior During Therapy: WFL for tasks assessed/performed Overall Cognitive Status: Within Functional Limits for tasks assessed                                          Exercises     Shoulder Instructions  General Comments Pt reports ordering pulse ox for home use. Education on ECS at home such as taking lukewarm showers at first. Pt reports family can assist with IADLs at home.     Pertinent Vitals/ Pain       Pain Assessment: No/denies pain  Home Living                                          Prior Functioning/Environment              Frequency  Min 2X/week        Progress Toward Goals  OT Goals(current goals can  now be found in the care plan section)  Progress towards OT goals: Progressing toward goals  Acute Rehab OT Goals Patient Stated Goal: Now planning to d/c to mom's home with family assist OT Goal Formulation: With patient Time For Goal Achievement: 09/28/19 Potential to Achieve Goals: Good  Plan Discharge plan needs to be updated    Co-evaluation                 AM-PAC OT "6 Clicks" Daily Activity     Outcome Measure   Help from another person eating meals?: None Help from another person taking care of personal grooming?: None Help from another person toileting, which includes using toliet, bedpan, or urinal?: A Little Help from another person bathing (including washing, rinsing, drying)?: A Little Help from another person to put on and taking off regular upper body clothing?: None Help from another person to put on and taking off regular lower body clothing?: None 6 Click Score: 22    End of Session Equipment Utilized During Treatment: Oxygen;Other (comment)(3L O2)  OT Visit Diagnosis: Unsteadiness on feet (R26.81);Other abnormalities of gait and mobility (R26.89);Muscle weakness (generalized) (M62.81)   Activity Tolerance Patient tolerated treatment well   Patient Left in bed;with call bell/phone within reach;Other (comment)(sitting EOB)   Nurse Communication Mobility status        Time: [id number] OT Time Calculation (min): 39 min  Charges: OT General Charges $OT Visit: 1 Visit OT Treatments $Self Care/Home Management : 38-52 mins  Lanier Clam., COTA/L Acute Rehabilitation Services [id number]    Ihor Gully 09/20/2019, 3:08 PM

## 2019-09-20 NOTE — Discharge Summary (Signed)
Physician Discharge Summary  Deborah Durham [id number] DOB: April 24, 1971 DOA: 09/06/2019  PCP: Patient, No Pcp Per  Admit date: 09/06/2019 Discharge date: 09/20/2019   Code Status: Full Code  Admitted From: Home Discharged to: [place]: [facility] Equipment/Devices: Rolling walker, oxygen Discharge Condition: Stable  Recommendations for Outpatient Follow-up   1. To follow-up with ID 2. Reassess O2 needs 3. On Phenergan for the next 10 days for nausea prophylaxis, make sure this is discontinued as this should not be a permanent medication 4. Continue Bactrim daily then changed to prophylaxis on 2/1 5. [facility] Summary  This is a 49 year old female without a significant past medical history who presented to the hospital with 2 weeks of worsening shortness of breath, subjective fevers and decreased appetite with DOE, multiple negative COVID-19 tests prior to admission as well as on admission.  Negative viral respiratory panel.  Initially on CAP treatment however she was diagnosed with HIV this admission and multifocal pneumonia on CT scan concerning for PJP.  CAP Treatment discontinued. ID and pulmonology have been consulted this admission.  Was on Solu-Medrol 40 mg IV twice daily as well as Bactrim IV and started on Biktarvy.  She has since been requiring O2 via nasal cannula with improving O2 requirements.  PT/OT initially recommending CIR however disposition improved and discharged home with home health and O2.  Hospitalization complicated by nausea/vomiting and poor p.o. intake.  This was thought to be medication induced and was started on prophylactic Phenergan prior to Bactrim and Biktarvy with resolution of symptoms.  She was discharged with limited supply of Phenergan prior to Bactrim.  Plan for follow-up with ID in near future. 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 Acute hypoxic respiratory failure secondary to suspected pneumocystis pneumonia in setting of newly diagnosed HIV a. Discharged with continuous O2 via 3 L/min nasal cannula b. Due to nausea/vomiting patient has been resistant to taking medications, now doing much better with scheduled Phenergan  c. Recommend Phenergan 30 minutes prior to p.o. Bactrim and Biktarvy 2. Newly diagnosed HIV a. CD4<35 on 09/09/2019 b. Currently on Bactrim p.o. and Biktarvy p.o., off prednisone c. To follow-up with ID 3. Trace pericardial effusion a. Outpatient follow-up 4. Elevated LFTs resolved 5. Nausea and vomiting, medication versus gastritis induced a. Resolved with prophylactic Phenergan prior to p.o. meds 6. Anxiety due to fear of shortness of breath on ambulation Reassurance given 7. Hypokalemia resolved 8. Hypophosphatemia resolved 9. Hypomagnesemia resolved    Consultants  . ID . Pulmonology  Procedures  . None  Antibiotics   Anti-infectives (From admission, onward)   Start     Dose/Rate Route Frequency Ordered Stop   09/20/19 0000  bictegravir-emtricitabine-tenofovir AF (BIKTARVY) 50-200-25 MG TABS tablet  Status:  Discontinued     1 tablet Oral Daily 09/20/19 1314 09/20/19    09/20/19 0000  sulfamethoxazole-trimethoprim (BACTRIM DS) 800-160 MG tablet  Status:  Discontinued     2 tablet Oral Every 8 hours 09/20/19 1314 09/20/19    09/20/19 0000  sulfamethoxazole-trimethoprim (BACTRIM DS) 800-160 MG tablet  Status:  Discontinued     1 tablet Oral 3 times weekly 09/20/19 1314 09/20/19    09/20/19 0000  sulfamethoxazole-trimethoprim (BACTRIM DS) 800-160 MG  tablet     1 tablet Oral 3 times weekly 09/20/19 1355     09/20/19 0000  sulfamethoxazole-trimethoprim (BACTRIM DS) 800-160 MG tablet     2 tablet Oral Every 8 hours 09/20/19 1355 09/28/19 2359    09/20/19 0000  bictegravir-emtricitabine-tenofovir AF (BIKTARVY) 50-200-25 MG TABS tablet     1 tablet Oral Daily 09/20/19 1355     09/18/19 1400  sulfamethoxazole-trimethoprim (BACTRIM DS) 800-160 MG per tablet 2 tablet     2 tablet Oral Every 8 hours 09/18/19 1036     09/17/19 1400  sulfamethoxazole-trimethoprim (BACTRIM) 400 mg of trimethoprim in dextrose 5 % 500 mL IVPB  Status:  Discontinued     400 mg of trimethoprim 350 mL/hr over 90 Minutes Intravenous Every 8 hours 09/17/19 1019 09/18/19 1036   09/16/19 1400  sulfamethoxazole-trimethoprim (BACTRIM DS) 800-160 MG per tablet 2 tablet  Status:  Discontinued     2 tablet Oral Every 8 hours 09/16/19 1057 09/17/19 1019   09/09/19 1630  bictegravir-emtricitabine-tenofovir AF (BIKTARVY) 50-200-25 MG per tablet 1 tablet     1 tablet Oral Daily 09/09/19 1534     09/09/19 0900  sulfamethoxazole-trimethoprim (BACTRIM) 400 mg of trimethoprim in dextrose 5 % 500 mL IVPB  Status:  Discontinued     400 mg of trimethoprim 350 mL/hr over 90 Minutes Intravenous Every 8 hours 09/09/19 0852 09/16/19 1057   09/08/19 1215  sulfamethoxazole-trimethoprim (BACTRIM) 400-80 MG per tablet 1 tablet  Status:  Discontinued     1 tablet Oral Every 12 hours 09/08/19 1206 09/09/19 0852   09/07/19 2200  azithromycin (ZITHROMAX) 500 mg in sodium chloride 0.9 % 250 mL IVPB     500 mg 250 mL/hr over 60 Minutes Intravenous Every 24 hours 09/07/19 0307 09/09/19 0020   09/07/19 2200  cefTRIAXone (ROCEPHIN) 1 g in sodium chloride 0.9 % 100 mL IVPB  Status:  Discontinued     1 g 200 mL/hr over 30 Minutes Intravenous Every 24 hours 09/07/19 0307 09/09/19 1534   09/06/19 2200  cefTRIAXone (ROCEPHIN) 1 g in sodium chloride 0.9 % 100 mL IVPB     1 g 200 mL/hr over 30 Minutes Intravenous  Once 09/06/19 2133 09/07/19 0045   09/06/19 2200  azithromycin (ZITHROMAX) 500 mg in sodium chloride 0.9 % 250 mL IVPB     500 mg 250 mL/hr over 60 Minutes Intravenous  Once 09/06/19 2133  09/07/19 0157        Subjective  Patient seen and examined at bedside no acute distress and resting comfortably.  No events overnight.  Tolerating diet.  Nausea and vomiting have resolved.  In good spirits and anticipating discharge.  She wishes to update her family on her own  Denies any chest pain,  fever, nausea, vomiting, urinary or bowel complaints. Otherwise ROS negative   Objective   Discharge Exam: Vitals:   09/20/19 1153 09/20/19 1201  BP: 99/70   Pulse: (!) 109 98  Resp: (!) 26 (!) 25  Temp: 97.8 F (36.6 C)   SpO2: 93% 100%   Vitals:   09/20/19 0415 09/20/19 0815 09/20/19 1153 09/20/19 1201  BP: 100/80 (!) 98/58 99/70   Pulse: (!) 105 (!) 104 (!) 109 98  Resp: 17 (!) 24 (!) 26 (!) 25  Temp: 98.2 F (36.8 C) 98.4 F (36.9 C) 97.8 F (36.6 C)   TempSrc: Oral Oral Oral   SpO2: 100% 100% 93% 100%  Weight:      Height:  Physical Exam Vitals and nursing note reviewed.  Constitutional:      Appearance: Normal appearance.  HENT:     Head: Normocephalic and atraumatic.     Nose: Nose normal.     Mouth/Throat:     Mouth: Mucous membranes are moist.  Eyes:     Extraocular Movements: Extraocular movements intact.  Cardiovascular:     Rate and Rhythm: Normal rate and regular rhythm.  Pulmonary:     Effort: Pulmonary effort is normal.     Breath sounds: Normal breath sounds.     Comments: On O2 Abdominal:     General: Abdomen is flat.     Palpations: Abdomen is soft.  Musculoskeletal:        General: No swelling. Normal range of motion.     Cervical back: Normal range of motion. No rigidity.  Neurological:     General: No focal deficit present.     Mental Status: She is alert. Mental status is at baseline.  Psychiatric:        Mood and Affect: Mood normal.        Behavior: Behavior normal.       The results of significant diagnostics from this hospitalization (including imaging, microbiology, ancillary and laboratory) are listed below for  reference.     Microbiology: Recent Results (from the past 240 hour(s))  MRSA PCR Screening     Status: None   Collection Time: 09/13/19  3:54 AM   Specimen: Nasopharyngeal  Result Value Ref Range Status   MRSA by PCR NEGATIVE NEGATIVE Final    Comment:        The GeneXpert MRSA Assay (FDA approved for NASAL specimens only), is one component of a comprehensive MRSA colonization surveillance program. It is not intended to diagnose MRSA infection nor to guide or monitor treatment for MRSA infections. Performed at [facility], [place] [address], [place], [place] [address]      Labs: BNP (last 3 results) Recent Labs    09/06/19 2042  BNP [id number]   Basic Metabolic Panel: Recent Labs  Lab 09/14/19 1734 09/14/19 1734 09/15/19 0548 09/16/19 0248 09/17/19 1011 09/18/19 1210 09/19/19 0241  NA 130*   < > 135 134* 137 136 134*  K 4.3   < > 4.5 4.3 3.4* 3.3* 3.9  CL 96*   < > 98 100 104 101 103  CO2 20*   < > 24 25 23 24 23   GLUCOSE 179*   < > 78 79 133* 142* 99  BUN 8   < > 8 9 14 6 7   CREATININE 0.69   < > 0.57 0.68 0.79 0.72 0.67  CALCIUM 8.8*   < > 9.0 9.0 8.7* 8.5* 8.1*  MG 1.9   < > 2.0 2.0 1.7 1.7 2.1  PHOS 3.4  --  4.0 4.2 2.2* 3.1  --    < > = values in this interval not displayed.   Liver Function Tests: Recent Labs  Lab 09/14/19 1734 09/15/19 0548 09/16/19 0248 09/17/19 1011 09/18/19 1210  AST 38 28 25 20 20   ALT 63* 52* 48* 33 28  ALKPHOS 60 54 53 49 49  BILITOT 0.2* 0.5 0.4 0.3 0.3  PROT 7.1 6.8 6.6 6.1* 5.9*  ALBUMIN 2.4* 2.4* 2.5* 2.3* 2.2*   No results for input(s): LIPASE, AMYLASE in the last 168 hours. No results for input(s): AMMONIA in the last 168 hours. CBC: Recent Labs  Lab 09/15/19 0548 09/16/19 0248 09/17/19 1011 09/18/19  1210 09/19/19 0241  WBC 10.5 8.2 13.5* 5.9 6.2  HGB 10.2* 9.9* 9.1* 9.2* 8.7*  HCT 31.1* 30.1* 29.0* 29.2* 27.3*  MCV 82.9 83.4 86.3 85.9 85.3  PLT 465* 462* 451* 365 327   Cardiac Enzymes: No results  for input(s): CKTOTAL, CKMB, CKMBINDEX, TROPONINI in the last 168 hours. BNP: Invalid input(s): POCBNP CBG: No results for input(s): GLUCAP in the last 168 hours. D-Dimer No results for input(s): DDIMER in the last 72 hours. Hgb A1c No results for input(s): HGBA1C in the last 72 hours. Lipid Profile No results for input(s): CHOL, HDL, LDLCALC, TRIG, CHOLHDL, LDLDIRECT in the last 72 hours. Thyroid function studies No results for input(s): TSH, T4TOTAL, T3FREE, THYROIDAB in the last 72 hours.  Invalid input(s): FREET3 Anemia work up No results for input(s): VITAMINB12, FOLATE, FERRITIN, TIBC, IRON, RETICCTPCT in the last 72 hours. Urinalysis No results found for: COLORURINE, APPEARANCEUR, [place], [place], [place], [place], [place], [place], PROTEINUR, UROBILINOGEN, NITRITE, LEUKOCYTESUR Sepsis Labs Invalid input(s): PROCALCITONIN,  WBC,  LACTICIDVEN Microbiology Recent Results (from the past 240 hour(s))  MRSA PCR Screening     Status: None   Collection Time: 09/13/19  3:54 AM   Specimen: Nasopharyngeal  Result Value Ref Range Status   MRSA by PCR NEGATIVE NEGATIVE Final    Comment:        The GeneXpert MRSA Assay (FDA approved for NASAL specimens only), is one component of a comprehensive MRSA colonization surveillance program. It is not intended to diagnose MRSA infection nor to guide or monitor treatment for MRSA infections. Performed at [facility], [place] [address], [place], [place] [address]     Discharge Instructions     Discharge Instructions    Ambulatory referral to Physical Therapy   Complete by: As directed    Diet - low sodium heart healthy   Complete by: As directed    Discharge instructions   Complete by: As directed    You were seen and examined in the hospital for lung infection and cared for by a hospitalist and infectious disease specialist  Upon Discharge:  - Take Phenergan up to 30 minutes prior to taking Bactrim  (Sulfamethoxazole-trimethoprim) every 8 hours as needed - Take Bactrim every 8 hours to completion, last dose 1/30 - Take Biktarvy for HIV as prescribed and follow up with the infectious disease doctors in the office - Follow up with your primary care doctor - work with home health Make an appointment with your primary care physician within 7 days Get lab work prior to your follow up appointment with your PCP Bring all home medications to your appointment to review Request that your primary physician go over all hospital tests and procedures/radiological results at the follow up.   Please get all hospital records sent to your physician by signing a hospital release before you go home.     Read the complete instructions along with all the possible side effects for all the medicines you take and that have been prescribed to you. Take any new medicines after you have completely understood and accept all the possible adverse reactions/side effects.   If you have any questions about your discharge medications or the care you received while you were in the hospital, you can call the unit and asked to speak with the hospitalist on call. Once you are discharged, your primary care physician will handle any further medical issues. Please note that NO REFILLS for any discharge medications will be authorized, as it is imperative  that you return to your primary care physician (or establish a relationship with a primary care physician if you do not have one) for your aftercare needs so that they can reassess your need for medications and monitor your lab values.   Do not drive, operate heavy machinery, perform activities at heights, swimming or participation in water activities or provide baby sitting services if your were admitted for loss of consciousness/seizures or if you are on sedating medications including, but not limited to benzodiazepines, sleep medications, narcotic pain medications, etc., until you  have been cleared to do so by a medical doctor.   Do not take more than prescribed medications.   Wear a seat belt while driving.  If you have smoked or chewed Tobacco in the last 2 years please stop smoking; also stop any regular Alcohol and/or any Recreational drug use including marijuana.  If you experience worsening of your admission symptoms or develop shortness of breath, chest pain, suicidal or homicidal thoughts or experience a life threatening emergency, you must seek medical attention immediately by calling 911 or calling your PCP immediately.   Increase activity slowly   Complete by: As directed      Allergies as of 09/20/2019   No Known Allergies     Medication List    TAKE these medications   acetaminophen 500 MG tablet Commonly known as: TYLENOL Take 1,000 mg by mouth every 6 (six) hours as needed for fever or headache.   ALIVE WOMENS GUMMY PO Take 1 tablet by mouth daily.   bictegravir-emtricitabine-tenofovir AF 50-200-25 MG Tabs tablet Commonly known as: BIKTARVY Take 1 tablet by mouth daily. Please separate by 4 hours from multivitamins   guaiFENesin 600 MG 12 hr tablet Commonly known as: MUCINEX Take 1,200 mg by mouth 2 (two) times daily as needed for cough or to loosen phlegm.   ondansetron 4 MG tablet Commonly known as: Zofran Take 1 tablet (4 mg total) by mouth daily as needed for nausea or vomiting. Take 30 minutes before Bactrim to prevent N/V   promethazine 12.5 MG tablet Commonly known as: PHENERGAN Take 1 tablet (12.5 mg total) by mouth every 8 (eight) hours as needed for up to 10 days for nausea or vomiting (30 min prior to Bactrim).   sulfamethoxazole-trimethoprim 800-160 MG tablet Commonly known as: BACTRIM DS Take 1 tablet by mouth 3 (three) times a week. Take on Monday, Wednesday, Friday starting on 2/1   sulfamethoxazole-trimethoprim 800-160 MG tablet Commonly known as: BACTRIM DS Take 2 tablets by mouth every 8 (eight) hours for 8  days. Continue through 1/30            Durable Medical Equipment  (From admission, onward)         Start     Ordered   09/20/19 1320  DME Oxygen  Once    Question Answer Comment  Length of Need 6 Months   Mode or (Route) Nasal cannula   Liters per Minute 3   Frequency Continuous (stationary and portable oxygen unit needed)   Oxygen delivery system Gas      09/20/19 1322   09/19/19 1606  For home use only DME Walker rolling  Once    Question Answer Comment  Walker: With [facility]   Patient needs a walker to treat with the following condition Weakness      09/19/19 [facility] Follow  up.   Specialty: Rehabilitation Why: They will call you set up your first physical therapy session this week Contact information: [place] [id number] [place]       Michel Bickers, MD Follow up.   Specialty: Infectious Diseases Why: 09/24/19 at 9:15 am. Please call to reschedule if you are unable to make this appointment.  Contact information: [address] [place] [address] [phone]        Steffanie Rainwater, DPM .   Specialty: Podiatry Contact information: [place] [address] [phone]        Lesleigh Noe, MD Follow up.   Specialty: Family Medicine Why: October 08, 2019 at [address] information: [place] [address] [phone]          No Known Allergies  Time coordinating discharge: Over 30 minutes   SIGNED:   Harold Hedge, D.O. [facility] Pager: [phone]  09/20/2019, 3:16 PM

## 2019-09-20 NOTE — Progress Notes (Signed)
Physical Therapy Treatment Patient Details Name: Deborah Durham MRN: [id number] DOB: 06-19-1971 Today's Date: 09/20/2019    History of Present Illness Pt is a 49 y.o. female admitted 09/06/19 for 2-week history of SOB, fevers, and decreased appetite. 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 No significant PMH.   PT Comments    Pt progressing well with mobility; hopeful for d/c home today. Hallway ambulation without DME, pt requiring 3L O2 [place] to maintain SpO2 >/88% (see saturations qualifications note). Educ re: energy conservation, O2 use, pulse ox use, strategies for anxiety/deep breathing, activity recommendations, importance of continued mobility. Pt plans to follow-up with outpatient PT services.    Follow Up Recommendations  Outpatient PT;Supervision - Intermittent     Equipment Recommendations  Rolling walker with 5" wheels(pt request)    Recommendations for Other Services       Precautions / Restrictions Precautions Precautions: Fall Restrictions Weight Bearing Restrictions: No    Mobility  Bed Mobility Overal bed mobility: Independent                Transfers Overall transfer level: Independent Equipment used: None                Ambulation/Gait Ambulation/Gait assistance: Supervision Gait Distance (Feet): 300 Feet Assistive device: IV Pole;None Gait Pattern/deviations: Step-through pattern;Decreased stride length   Gait velocity interpretation: <1.8 ft/sec, indicate of risk for recurrent falls General Gait Details: Slow, steady gait initially holding onto IV pole, progressing to no UE support; supervision to monitor vitals and cues for rest breaks for deep breathing to lower HR and increase SpO2. HR 120-140s, SpO2 down to 80% on RA, maintaining >/90% on 3L   Stairs             Wheelchair Mobility    Modified Rankin (Stroke Patients Only)       Balance Overall balance assessment: Needs assistance Sitting-balance  support: Feet supported;No upper extremity supported Sitting balance-Leahy Scale: Good     Standing balance support: No upper extremity supported;During functional activity Standing balance-Leahy Scale: Good                              Cognition Arousal/Alertness: Awake/alert Behavior During Therapy: WFL for tasks assessed/performed Overall Cognitive Status: Within Functional Limits for tasks assessed                                        Exercises      General Comments General comments (skin integrity, edema, etc.): Educ re: breathing/anxiety strategies (phone apps ex. "Headspace" and "Breathe" to slow breathing and lower HR), recommendation to purchase pulse ox (pt to order off [facility]), portable O2 use (pt able to demonstrate correct technique)      Pertinent Vitals/Pain Pain Assessment: No/denies pain    Home Living                      Prior Function            PT Goals (current goals can now be found in the care plan section) Progress towards PT goals: Progressing toward goals    Frequency    Min 3X/week      PT Plan Discharge plan needs to be updated    Co-evaluation  AM-PAC PT "6 Clicks" Mobility   Outcome Measure  Help needed turning from your back to your side while in a flat bed without using bedrails?: None Help needed moving from lying on your back to sitting on the side of a flat bed without using bedrails?: None Help needed moving to and from a bed to a chair (including a wheelchair)?: None Help needed standing up from a chair using your arms (e.g., wheelchair or bedside chair)?: None Help needed to walk in hospital room?: None Help needed climbing 3-5 steps with a railing? : None 6 Click Score: 24    End of Session Equipment Utilized During Treatment: Oxygen Activity Tolerance: Patient tolerated treatment well Patient left: in chair;with call bell/phone within reach Nurse  Communication: Mobility status PT Visit Diagnosis: Unsteadiness on feet (R26.81);Other abnormalities of gait and mobility (R26.89);Muscle weakness (generalized) (M62.81)     Time: [id number] PT Time Calculation (min) (ACUTE ONLY): 24 min  Charges:  $Gait Training: 8-22 mins $Self Care/Home Management: [place], PT, DPT Acute Rehabilitation Services  Pager [phone] Office [place] 09/20/2019, 12:33 PM

## 2019-09-20 NOTE — Progress Notes (Signed)
SATURATION QUALIFICATIONS: (This note is used to comply with regulatory documentation for home oxygen)  Patient Saturations on Room Air at Rest = 87%  Patient Saturations on Room Air while Ambulating = 80%  Patient Saturations on 3 Liters of oxygen while Ambulating = 92%  Please briefly explain why patient needs home oxygen: Pt requires supplemental oxygen to maintain SpO2 >/88% at rest and with mobility.  Mabeline Caras, PT, DPT Acute Rehabilitation Services  Pager [phone] Office [phone]

## 2019-09-20 NOTE — Progress Notes (Signed)
[facility] for Infectious Disease  Date of Admission:  09/06/2019     Total days of antibiotics 11         ASSESSMENT:  Deborah Durham is feeling much better today and clinically looks to be doing very well. She is tolerating her medications without adverse side effects with addition of promethazine. Discussed plan of care to continue daily Bactrim for treatment of PCP through 1/30 and then to prophylaxis. Continue current dose of Biktarvy and use promethazine as needed. Will arrange the [facility] pharmacy to bring medications prior to discharge. Ms. Foo is okay for discharge from ID standpoint. Will arrange close follow up in the clinic.   PLAN:  1. Continue Biktarvy, Bactrim, and as needed promethazine.  [place] for discharge from ID standpoint. 3. Will arrange follow up in [place] clinic.    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 bictegravir-emtricitabine-tenofovir AF  1 tablet Oral Daily  . enoxaparin (LOVENOX) injection  40 mg Subcutaneous Daily  . guaiFENesin  600 mg Oral BID  . pantoprazole (PROTONIX) IV  40 mg Intravenous Q24H  . polyethylene glycol  17 g Oral Daily  . promethazine  12.5 mg Intravenous BID  . promethazine  25 mg Intravenous Daily  . sodium chloride flush  10-40 mL Intracatheter Q12H  . sucralfate  1 g Oral TID WC & HS  . sulfamethoxazole-trimethoprim  2 tablet Oral Q8H    SUBJECTIVE:   Afebrile overnight with no acute events. Slept well yesterday and feeling much better today. Improved tolerance of her medication with the addition of the phenergan.   No Known Allergies   Review of Systems: Review of Systems  Constitutional: Negative for chills, fever and weight loss.  Respiratory: Negative for cough, shortness of breath and  wheezing.   Cardiovascular: Negative for chest pain and leg swelling.  Gastrointestinal: Negative for abdominal pain, constipation, diarrhea, nausea and vomiting.  Skin: Negative for rash.      OBJECTIVE: Vitals:   09/20/19 0415 09/20/19 0815 09/20/19 1153 09/20/19 1201  BP: 100/80 (!) 98/58 99/70   Pulse: (!) 105 (!) 104 (!) 109 98  Resp: 17 (!) 24 (!) 26 (!) 25  Temp: 98.2 F (36.8 C) 98.4 F (36.9 C) 97.8 F (36.6 C)   TempSrc: Oral Oral Oral   SpO2: 100% 100% 93% 100%  Weight:      Height:       Body mass index is 26.21 kg/m.  Physical Exam Constitutional:      General: She is not in acute distress.    Appearance: She is well-developed.     Interventions: Nasal cannula in place.     Comments: Lying in bed with head of bed elevated; pleasant.   Eyes:     Conjunctiva/sclera: Conjunctivae normal.  Cardiovascular:     Rate and Rhythm: Normal rate and regular rhythm.     Heart sounds: Normal heart sounds. No murmur. No friction rub. No gallop.   Pulmonary:     Effort: Pulmonary effort is normal. No respiratory distress.     Breath sounds: Normal breath sounds. No wheezing or rales.  Chest:     Chest wall: No tenderness.  Abdominal:     General: Bowel sounds are normal.  Palpations: Abdomen is soft.     Tenderness: There is no abdominal tenderness.  Musculoskeletal:     Cervical back: Neck supple.  Lymphadenopathy:     Cervical: No cervical adenopathy.  Skin:    General: Skin is warm and dry.     Findings: No rash.  Neurological:     Mental Status: She is alert and oriented to person, place, and time.  Psychiatric:        Behavior: Behavior normal.        Thought Content: Thought content normal.        Judgment: Judgment normal.     Lab Results Lab Results  Component Value Date   WBC 6.2 09/19/2019   HGB 8.7 (L) 09/19/2019   HCT 27.3 (L) 09/19/2019   MCV 85.3 09/19/2019   PLT 327 09/19/2019    Lab Results  Component Value Date   CREATININE  0.67 09/19/2019   BUN 7 09/19/2019   NA 134 (L) 09/19/2019   K 3.9 09/19/2019   CL 103 09/19/2019   CO2 23 09/19/2019    Lab Results  Component Value Date   ALT 28 09/18/2019   AST 20 09/18/2019   ALKPHOS 49 09/18/2019   BILITOT 0.3 09/18/2019     Microbiology: Recent Results (from the past 240 hour(s))  MRSA PCR Screening     Status: None   Collection Time: 09/13/19  3:54 AM   Specimen: Nasopharyngeal  Result Value Ref Range Status   MRSA by PCR NEGATIVE NEGATIVE Final    Comment:        The GeneXpert MRSA Assay (FDA approved for NASAL specimens only), is one component of a comprehensive MRSA colonization surveillance program. It is not intended to diagnose MRSA infection nor to guide or monitor treatment for MRSA infections. Performed at [facility], [place] [address] [place], [place] [address]      Terri Piedra, [place] [facility] [phone] Pager  09/20/2019  1:51 PM

## 2019-09-20 NOTE — TOC Initial Note (Signed)
Transition of Care [facility]) - Initial/Assessment Note    Patient Details  Name: Deborah Durham MRN: [id number] Date of Birth: 05/07/1971  Transition of [facility]) CM/SW Contact:    Marilu Favre, RN Phone Number: 09/20/2019, 1:00 PM  Clinical Narrative:                  Patient from home. See previous TOC note.  Requested order for home oxygen. Called Zack with [facility].   Discussed with patient. [facility] will bring a portable oxygen tank to patient room prior to discharge.   Patient does not have a PCP. Discussed she can call number on [employer] card and be provided a complete list of MD's in network. If a friend / family member recommends a MD she can call office directly. Patient lives close to [facility] and interested in [place] care there. Called Spoke to robin. Shirlean Mylar will call NCM back.  Expected Discharge Plan: Home/Self Care Barriers to Discharge: No Barriers Identified   Patient Goals and CMS Choice Patient states their goals for this hospitalization and ongoing recovery are:: to go home CMS Medicare.gov Compare Post Acute Care list provided to:: Patient Choice offered to / list presented to : Patient  Expected Discharge Plan and Services Expected Discharge Plan: Home/Self Care   Discharge Planning Services: CM Consult   Living arrangements for the past 2 months: Single Family Home                 DME Arranged: [place], Oxygen DME Agency: [facility] Date DME Agency Contacted: 09/20/19 Time DME Agency Contacted: 44 Representative spoke with at DME Agency: [place]: Refused HH          Prior Living Arrangements/Services Living arrangements for the past 2 months: [place] Lives with:: Relatives Patient language and need for interpreter reviewed:: Yes Do you feel safe going back to the place where you live?: Yes      Need for Family Participation in Patient Care: Yes (Comment) Care  giver support system in place?: Yes (comment)   Criminal Activity/Legal Involvement Pertinent to Current Situation/Hospitalization: No - Comment as needed  Activities of Daily Living Home Assistive Devices/Equipment: None ADL Screening (condition at time of admission) Patient's cognitive ability adequate to safely complete daily activities?: Yes Is the patient deaf or have difficulty hearing?: No Does the patient have difficulty seeing, even when wearing glasses/contacts?: No Does the patient have difficulty concentrating, remembering, or making decisions?: No Patient able to express need for assistance with ADLs?: Yes Does the patient have difficulty dressing or bathing?: No Independently performs ADLs?: Yes (appropriate for developmental age) Does the patient have difficulty walking or climbing stairs?: No Weakness of Legs: Both Weakness of Arms/Hands: None  Permission Sought/Granted   Permission granted to share information with : Yes, Verbal Permission Granted     Permission granted to share info w AGENCY: [facility]        Emotional Assessment Appearance:: Appears stated age Attitude/Demeanor/Rapport: Engaged Affect (typically observed): Accepting Orientation: : Oriented to Self, Oriented to Place, Oriented to  Time, Oriented to Situation Alcohol / Substance Use: Not Applicable Psych Involvement: No (comment)  Admission diagnosis:  CAP (community acquired pneumonia) [J18.9] Community acquired pneumonia, unspecified laterality [J18.9] Patient Active Problem List   Diagnosis Date Noted  . Visual hallucinations 09/11/2019  . Acute hypoxemic respiratory failure ([place]) 09/10/2019  . Nausea & vomiting 09/10/2019  . Transaminitis 09/10/2019  .  Normocytic anemia 09/08/2019  . HIV disease ([place]) 09/08/2019  . Status post cholecystectomy 09/08/2019  . 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

## 2019-09-24 ENCOUNTER — Inpatient Hospital Stay: Payer: BC Managed Care – PPO | Admitting: Internal Medicine

## 2019-09-24 LAB — HIV GENOSURE(R) MG

## 2019-09-24 LAB — HIV-1 RNA, PCR (GRAPH) RFX/GENO EDI
HIV-1 RNA BY PCR: 979000 copies/mL
HIV-1 RNA Quant, Log: 5.991 log10copy/mL

## 2019-09-24 LAB — REFLEX TO GENOSURE(R) MG EDI: HIV GenoSure(R): 1

## 2019-10-07 ENCOUNTER — Other Ambulatory Visit: Payer: Self-pay

## 2019-10-07 ENCOUNTER — Encounter: Payer: Self-pay | Admitting: Internal Medicine

## 2019-10-07 ENCOUNTER — Ambulatory Visit: Payer: BC Managed Care – PPO | Admitting: Internal Medicine

## 2019-10-07 DIAGNOSIS — R112 Nausea with vomiting, unspecified: Secondary | ICD-10-CM | POA: Diagnosis not present

## 2019-10-07 DIAGNOSIS — B20 Human immunodeficiency virus [HIV] disease: Secondary | ICD-10-CM

## 2019-10-07 DIAGNOSIS — B59 Pneumocystosis: Secondary | ICD-10-CM | POA: Diagnosis not present

## 2019-10-07 NOTE — Assessment & Plan Note (Signed)
I reviewed her baseline CD4 count of less than 35 and talked to her about the interpretation of CD4 and viral load.  She is off to a good start with her Biktarvy.  I will repeat lab work today and see her back in 2 weeks.  Husband will come with her to her next visit for partner testing and education.

## 2019-10-07 NOTE — Progress Notes (Signed)
Patient Active Problem List   Diagnosis Date Noted  . Acute hypoxemic respiratory failure ([place]) 09/10/2019    Priority: High  . HIV disease ([place]) 09/08/2019    Priority: High  . 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 Visual hallucinations 09/11/2019  . Nausea & vomiting 09/10/2019  . Transaminitis 09/10/2019  . Normocytic anemia 09/08/2019  . Status post cholecystectomy 09/08/2019    Patient's Medications  New Prescriptions   No medications on file  Previous Medications   ACETAMINOPHEN (TYLENOL) 500 MG TABLET    Take 1,000 mg by mouth every 6 (six) hours as needed for fever or headache.   BICTEGRAVIR-EMTRICITABINE-TENOFOVIR AF (BIKTARVY) 50-200-25 MG TABS TABLET    Take 1 tablet by mouth daily. Please separate by 4 hours from multivitamins   GUAIFENESIN (MUCINEX) 600 MG 12 HR TABLET    Take 1,200 mg by mouth 2 (two) times daily as needed for cough or to loosen phlegm.   MULTIPLE VITAMINS-MINERALS (ALIVE WOMENS GUMMY PO)    Take 1 tablet by mouth daily.   ONDANSETRON (ZOFRAN) 4 MG TABLET    Take 1 tablet (4 mg total) by mouth daily as needed for nausea or vomiting. Take 30 minutes before Bactrim to prevent N/V   PROMETHAZINE (PHENERGAN) 12.5 MG TABLET    Take 1 tablet (12.5 mg total) by mouth every 8 (eight) hours as needed for up to 10 days for nausea or vomiting (30 min prior to Bactrim).   SULFAMETHOXAZOLE-TRIMETHOPRIM (BACTRIM DS) 800-160 MG TABLET    Take 1 tablet by mouth 3 (three) times a week. Take on Monday, Wednesday, Friday starting on 2/1  Modified Medications   No medications on file  Discontinued Medications   No medications on file    Subjective: Deborah Durham is in for her hospital follow-up visit.  She was hospitalized last month with acute hypoxic respiratory failure and probable pneumocystis pneumonia.  She was diagnosed with advanced HIV infection.  Her CD4 count was less than 35.  Unfortunately her viral load was ordered  but never done.  She was treated empirically for pneumocystis pneumonia and had slow improvement.  She was started on Biktarvy while hospitalized.  She has not missed any doses of her Biktarvy or trimethoprim sulfamethoxazole since leaving the hospital.  She was discharged on home oxygen has been able to wean off.  She is feeling better.  She says that she has had some crying spells but is not feeling depressed.  She has shared the information about her infection with her husband.  She has not told her mother who is recovering from breast cancer or her 2 adult children.  She believes that she and her husband tested negative for HIV last year before moving to [place] from [place].  She says that her appetite is better.  She has had some episodes of nausea and vomiting.  This generally occurs in the evening after dinner.  She takes her medications in the morning.  Not think her nausea is due to her medication.  Review of Systems: Review of Systems  Constitutional: Positive for malaise/fatigue and weight loss. Negative for chills, diaphoresis and fever.  HENT: Negative for congestion and sore throat.   Respiratory: Positive for shortness of breath. Negative for cough and sputum production.   Cardiovascular: Negative for chest pain.  Gastrointestinal: Positive for nausea and vomiting. Negative for abdominal pain and diarrhea.  Skin: Negative for rash.  Psychiatric/Behavioral:  Positive for depression.    No past medical history on file.  Social History   Tobacco Use  . Smoking status: Never Smoker  . Smokeless tobacco: Never Used  Substance Use Topics  . Alcohol use: Never  . Drug use: Never    No family history on file.  No Known Allergies  Health Maintenance  Topic Date Due  . TETANUS/TDAP  02/01/1990  . PAP SMEAR-Modifier  02/02/1992  . INFLUENZA VACCINE  03/30/2019  . HIV Screening  Completed    Objective:  Vitals:   10/07/19 0923  BP: 117/79  Pulse: (!) 109  SpO2: 96%    Weight: 134 lb 12.8 oz (61.1 kg)   Body mass index is 24.66 kg/m.  Physical Exam Constitutional:      Comments: She is obviously feeling much better than when I last saw her in the hospital.  Cardiovascular:     Rate and Rhythm: Normal rate and regular rhythm.     Heart sounds: No murmur.  Pulmonary:     Effort: Pulmonary effort is normal.     Breath sounds: Normal breath sounds. No wheezing or rales.  Abdominal:     Palpations: Abdomen is soft.     Tenderness: There is no abdominal tenderness.  Skin:    Findings: No rash.  Neurological:     General: No focal deficit present.  Psychiatric:        Mood and Affect: Mood normal.     Lab Results Lab Results  Component Value Date   WBC 6.2 09/19/2019   HGB 8.7 (L) 09/19/2019   HCT 27.3 (L) 09/19/2019   MCV 85.3 09/19/2019   PLT 327 09/19/2019    Lab Results  Component Value Date   CREATININE 0.67 09/19/2019   BUN 7 09/19/2019   NA 134 (L) 09/19/2019   K 3.9 09/19/2019   CL 103 09/19/2019   CO2 23 09/19/2019    Lab Results  Component Value Date   ALT 28 09/18/2019   AST 20 09/18/2019   ALKPHOS 49 09/18/2019   BILITOT 0.3 09/18/2019    No results found for: CHOL, HDL, LDLCALC, LDLDIRECT, TRIG, CHOLHDL Lab Results  Component Value Date   LABRPR NON REACTIVE 09/09/2019   No results found for: HIV1RNAQUANT, HIV1RNAVL, CD4TABS   Problem List Items Addressed This Visit      High   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      HIV disease ([place])    I reviewed her baseline CD4 count of less than 35 and talked to her about the interpretation of CD4 and viral load.  She is off to a good start with her Biktarvy.  I will repeat lab work today and see her back in 2 weeks.  Husband will come with her to her next visit for partner testing and education.      Relevant Orders   T-helper cell (CD4)- ([facility] clinic only)   HIV-1 RNA quant-no reflex-bld    CBC   Comprehensive metabolic panel     Unprioritized   Nausea & vomiting    I am not sure what is causing her chronic, intermittent nausea.  It began after she was hospitalized and started on treatment for pneumonia and her HIV infection.  Does not seem to be any temporal association between taking her medications and nausea.  She does feel better with antiemetics.           Michel Bickers,  [place] for Infectious Disease [place] [phone] pager   [phone] cell 10/07/2019, 9:52 AM

## 2019-10-07 NOTE — Assessment & Plan Note (Signed)
She is recovering slowly from presumptive pneumocystis pneumonia.

## 2019-10-07 NOTE — Assessment & Plan Note (Signed)
I am not sure what is causing her chronic, intermittent nausea.  It began after she was hospitalized and started on treatment for pneumonia and her HIV infection.  Does not seem to be any temporal association between taking her medications and nausea.  She does feel better with antiemetics.

## 2019-10-08 ENCOUNTER — Ambulatory Visit: Payer: BC Managed Care – PPO | Admitting: Family Medicine

## 2019-10-08 DIAGNOSIS — Z0289 Encounter for other administrative examinations: Secondary | ICD-10-CM

## 2019-10-08 LAB — T-HELPER CELL (CD4) - (RCID CLINIC ONLY)
CD4 % Helper T Cell: 4 % — ABNORMAL LOW (ref 33–65)
CD4 T Cell Abs: 56 /uL — ABNORMAL LOW (ref 400–1790)

## 2019-10-10 LAB — COMPREHENSIVE METABOLIC PANEL
AG Ratio: 1.4 (calc) (ref 1.0–2.5)
ALT: 18 U/L (ref 6–29)
AST: 20 U/L (ref 10–35)
Albumin: 3.8 g/dL (ref 3.6–5.1)
Alkaline phosphatase (APISO): 55 U/L (ref 31–125)
BUN: 14 mg/dL (ref 7–25)
CO2: 24 mmol/L (ref 20–32)
Calcium: 9.4 mg/dL (ref 8.6–10.2)
Chloride: 105 mmol/L (ref 98–110)
Creat: 0.74 mg/dL (ref 0.50–1.10)
Globulin: 2.8 g/dL (calc) (ref 1.9–3.7)
Glucose, Bld: 95 mg/dL (ref 65–99)
Potassium: 4.8 mmol/L (ref 3.5–5.3)
Sodium: 136 mmol/L (ref 135–146)
Total Bilirubin: 0.3 mg/dL (ref 0.2–1.2)
Total Protein: 6.6 g/dL (ref 6.1–8.1)

## 2019-10-10 LAB — CBC
HCT: 31.7 % — ABNORMAL LOW (ref 35.0–45.0)
Hemoglobin: 10.1 g/dL — ABNORMAL LOW (ref 11.7–15.5)
MCH: 27.7 pg (ref 27.0–33.0)
MCHC: 31.9 g/dL — ABNORMAL LOW (ref 32.0–36.0)
MCV: 87.1 fL (ref 80.0–100.0)
MPV: 9.7 fL (ref 7.5–12.5)
Platelets: 385 10*3/uL (ref 140–400)
RBC: 3.64 10*6/uL — ABNORMAL LOW (ref 3.80–5.10)
RDW: 17 % — ABNORMAL HIGH (ref 11.0–15.0)
WBC: 4 10*3/uL (ref 3.8–10.8)

## 2019-10-10 LAB — HIV-1 RNA QUANT-NO REFLEX-BLD
HIV 1 RNA Quant: 88 copies/mL — ABNORMAL HIGH
HIV-1 RNA Quant, Log: 1.94 Log copies/mL — ABNORMAL HIGH

## 2019-10-24 ENCOUNTER — Other Ambulatory Visit: Payer: Self-pay

## 2019-10-24 ENCOUNTER — Encounter: Payer: Self-pay | Admitting: Internal Medicine

## 2019-10-24 ENCOUNTER — Ambulatory Visit (INDEPENDENT_AMBULATORY_CARE_PROVIDER_SITE_OTHER): Payer: BC Managed Care – PPO | Admitting: Internal Medicine

## 2019-10-24 DIAGNOSIS — B20 Human immunodeficiency virus [HIV] disease: Secondary | ICD-10-CM | POA: Diagnosis not present

## 2019-10-24 NOTE — Assessment & Plan Note (Signed)
She is markedly improved on therapy for recently diagnosed HIV infection.  Her viral load was nearly 1 million and is now down to 88.  She is starting to have CD4 reconstitution.  She has recovered from pneumocystis pneumonia.  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  She will follow-up here next month.

## 2019-10-24 NOTE — Progress Notes (Signed)
Patient Active Problem List   Diagnosis Date Noted  . Acute hypoxemic respiratory failure ([place]) 09/10/2019    Priority: High  . HIV disease ([place]) 09/08/2019    Priority: High  . 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) 09/06/2019    Priority: High  . Visual hallucinations 09/11/2019  . Nausea & vomiting 09/10/2019  . Transaminitis 09/10/2019  . Normocytic anemia 09/08/2019  . Status post cholecystectomy 09/08/2019    Patient's Medications  New Prescriptions   No medications on file  Previous Medications   ACETAMINOPHEN (TYLENOL) 500 MG TABLET    Take 1,000 mg by mouth every 6 (six) hours as needed for fever or headache.   BICTEGRAVIR-EMTRICITABINE-TENOFOVIR AF (BIKTARVY) 50-200-25 MG TABS TABLET    Take 1 tablet by mouth daily. Please separate by 4 hours from multivitamins   PROMETHAZINE (PHENERGAN) 12.5 MG TABLET    Take 1 tablet (12.5 mg total) by mouth every 8 (eight) hours as needed for up to 10 days for nausea or vomiting (30 min prior to Bactrim).   SULFAMETHOXAZOLE-TRIMETHOPRIM (BACTRIM DS) 800-160 MG TABLET    Take 1 tablet by mouth 3 (three) times a week. Take on Monday, Wednesday, Friday starting on 2/1  Modified Medications   No medications on file  Discontinued Medications   GUAIFENESIN (MUCINEX) 600 MG 12 HR TABLET    Take 1,200 mg by mouth 2 (two) times daily as needed for cough or to loosen phlegm.   MULTIPLE VITAMINS-MINERALS (ALIVE WOMENS GUMMY PO)    Take 1 tablet by mouth daily.   ONDANSETRON (ZOFRAN) 4 MG TABLET    Take 1 tablet (4 mg total) by mouth daily as needed for nausea or vomiting. Take 30 minutes before Bactrim to prevent N/V    Subjective: Deborah Durham is in for her routine HIV follow-up visit.  She has not had any problems obtaining, taking or tolerating her Biktarvy or trimethoprim sulfamethoxazole.  She keeps her medication in her purse and with her at all times.  She has not missed any doses.  She is feeling much better.  Following  treatment for pneumocystis pneumonia.  She recently climbs 3 flights of stairs and had only minimal shortness of breath at the very top.  She did not have to stop and rest.  Her husband is currently out of town but is coming back tomorrow for HIV testing.  Review of Systems: Review of Systems  Constitutional: Negative for fever.  Respiratory: Negative for cough and shortness of breath.   Cardiovascular: Negative for chest pain.  Psychiatric/Behavioral: Negative for depression.    No past medical history on file.  Social History   Tobacco Use  . Smoking status: Never Smoker  . Smokeless tobacco: Never Used  Substance Use Topics  . Alcohol use: Never  . Drug use: Never    No family history on file.  No Known Allergies  Health Maintenance  Topic Date Due  . TETANUS/TDAP  02/01/1990  . PAP SMEAR-Modifier  02/02/1992  . INFLUENZA VACCINE  03/30/2019  . HIV Screening  Completed    Objective:  Vitals:   10/24/19 0941  Weight: 135 lb (61.2 kg)  Height: 5\' 2"  (1.575 m)   Body mass index is 24.69 kg/m.  Physical Exam Constitutional:      Comments: She is in good spirits.  Cardiovascular:     Rate and Rhythm: Normal rate and regular rhythm.     Heart sounds: No murmur.  Pulmonary:  Effort: Pulmonary effort is normal.     Breath sounds: Normal breath sounds.  Psychiatric:        Mood and Affect: Mood normal.     Lab Results Lab Results  Component Value Date   WBC 4.0 10/07/2019   HGB 10.1 (L) 10/07/2019   HCT 31.7 (L) 10/07/2019   MCV 87.1 10/07/2019   PLT 385 10/07/2019    Lab Results  Component Value Date   CREATININE 0.74 10/07/2019   BUN 14 10/07/2019   NA 136 10/07/2019   K 4.8 10/07/2019   CL 105 10/07/2019   CO2 24 10/07/2019    Lab Results  Component Value Date   ALT 18 10/07/2019   AST 20 10/07/2019   ALKPHOS 49 09/18/2019   BILITOT 0.3 10/07/2019    No results found for: CHOL, HDL, LDLCALC, LDLDIRECT, TRIG, CHOLHDL Lab Results    Component Value Date   LABRPR NON REACTIVE 09/09/2019   HIV 1 RNA Quant (copies/mL)  Date Value  10/07/2019 88 (H)   CD4 T Cell Abs (/uL)  Date Value  10/07/2019 56 (L)     Problem List Items Addressed This Visit      High   HIV disease ([place])    She is markedly improved on therapy for recently diagnosed HIV infection.  Her viral load was nearly 1 million and is now down to 88.  She is starting to have CD4 reconstitution.  She has recovered from pneumocystis pneumonia.  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  She will follow-up here next month.           Michel Bickers, MD [facility] for [facility]ious [place] [facility] [phone] pager   [phone] cell 10/24/2019, 10:06 AM

## 2019-10-30 ENCOUNTER — Encounter: Payer: Self-pay | Admitting: Internal Medicine

## 2019-11-05 ENCOUNTER — Telehealth: Payer: Self-pay

## 2019-11-05 NOTE — Telephone Encounter (Signed)
Per MD called patient to follow up on paper work that employer sent. MD would like to confirm return to work date with patient before completing paper work. Left voicemail requesting she call office back. [place]

## 2019-11-05 NOTE — Telephone Encounter (Signed)
Patient returned call; per patient return to work date was scheduled for 4/7.  Patient would like office to fax paper work to employer after she reviews them. Will come by office later today. [place]

## 2019-11-14 ENCOUNTER — Other Ambulatory Visit: Payer: Self-pay

## 2019-11-14 ENCOUNTER — Ambulatory Visit: Payer: BC Managed Care – PPO | Admitting: Internal Medicine

## 2019-11-14 ENCOUNTER — Encounter: Payer: Self-pay | Admitting: Internal Medicine

## 2019-11-14 DIAGNOSIS — B20 Human immunodeficiency virus [HIV] disease: Secondary | ICD-10-CM

## 2019-11-14 NOTE — Assessment & Plan Note (Signed)
Her infection is coming under excellent control since starting Biktarvy 2 months ago.  She will follow-up in 6 months.

## 2019-11-14 NOTE — Progress Notes (Signed)
Patient Active Problem List   Diagnosis Date Noted  . Acute hypoxemic respiratory failure ([place]) 09/10/2019    Priority: High  . HIV disease ([place]) 09/08/2019    Priority: High  . 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 Visual hallucinations 09/11/2019  . Nausea & vomiting 09/10/2019  . Transaminitis 09/10/2019  . Normocytic anemia 09/08/2019  . Status post cholecystectomy 09/08/2019    Patient's Medications  New Prescriptions   No medications on file  Previous Medications   ACETAMINOPHEN (TYLENOL) 500 MG TABLET    Take 1,000 mg by mouth every 6 (six) hours as needed for fever or headache.   BICTEGRAVIR-EMTRICITABINE-TENOFOVIR AF (BIKTARVY) 50-200-25 MG TABS TABLET    Take 1 tablet by mouth daily. Please separate by 4 hours from multivitamins   PROMETHAZINE (PHENERGAN) 12.5 MG TABLET    Take 1 tablet (12.5 mg total) by mouth every 8 (eight) hours as needed for up to 10 days for nausea or vomiting (30 min prior to Bactrim).   SULFAMETHOXAZOLE-TRIMETHOPRIM (BACTRIM DS) 800-160 MG TABLET    Take 1 tablet by mouth 3 (three) times a week. Take on Monday, Wednesday, Friday starting on 2/1  Modified Medications   No medications on file  Discontinued Medications   No medications on file    Subjective: Deborah Durham is in for her routine HIV follow-up visit.  She denies having any problems obtaining, taking or tolerating her Biktarvy and has not missed a single dose since starting in January.  She is concerned about her weight gain but otherwise is feeling very well.  She says that she has separated from her husband and has moved down.  She says that he is not a concern of hers anymore.  She is not sure if he has been tested for HIV.  She plans to return to work at the [employer] center on 12/04/2019.  Review of Systems: Review of Systems  Constitutional: Negative for fever and weight loss.  Respiratory: Negative for cough and shortness of  breath.   Cardiovascular: Negative for chest pain.    No past medical history on file.  Social History   Tobacco Use  . Smoking status: Never Smoker  . Smokeless tobacco: Never Used  Substance Use Topics  . Alcohol use: Never  . Drug use: Never    No family history on file.  No Known Allergies  Health Maintenance  Topic Date Due  . TETANUS/TDAP  Never done  . PAP SMEAR-Modifier  Never done  . INFLUENZA VACCINE  Never done  . HIV Screening  Completed    Objective:  Vitals:   11/14/19 1004  BP: 108/73  Pulse: (!) 101  Temp: 98.5 F (36.9 C)  TempSrc: Oral  Weight: 144 lb (65.3 kg)   Body mass index is 26.34 kg/m.  Physical Exam Constitutional:      Comments: Her spirits are good.  Her weight is up 10 pounds in the last 6 weeks.  Cardiovascular:     Rate and Rhythm: Normal rate and regular rhythm.     Heart sounds: No murmur.  Pulmonary:     Effort: Pulmonary effort is normal.     Breath sounds: Normal breath sounds.  Psychiatric:        Mood and Affect: Mood normal.     Lab Results Lab Results  Component Value Date   WBC 4.0 10/07/2019   HGB 10.1 (L) 10/07/2019   HCT  31.7 (L) 10/07/2019   MCV 87.1 10/07/2019   PLT 385 10/07/2019    Lab Results  Component Value Date   CREATININE 0.74 10/07/2019   BUN 14 10/07/2019   NA 136 10/07/2019   K 4.8 10/07/2019   CL 105 10/07/2019   CO2 24 10/07/2019    Lab Results  Component Value Date   ALT 18 10/07/2019   AST 20 10/07/2019   ALKPHOS 49 09/18/2019   BILITOT 0.3 10/07/2019    No results found for: CHOL, HDL, LDLCALC, LDLDIRECT, TRIG, CHOLHDL Lab Results  Component Value Date   LABRPR NON REACTIVE 09/09/2019   HIV 1 RNA Quant (copies/mL)  Date Value  10/07/2019 88 (H)   CD4 T Cell Abs (/uL)  Date Value  10/07/2019 56 (L)     Problem List Items Addressed This Visit      High   HIV disease ([place])    Her infection is coming under excellent control since starting Biktarvy 2 months ago.   She will follow-up in 6 months.           Michel Bickers, MD [facility] [place] [facility] [phone] pager   [phone] cell 11/14/2019, 10:28 AM

## 2019-12-04 ENCOUNTER — Telehealth: Payer: Self-pay

## 2019-12-04 NOTE — Telephone Encounter (Signed)
Received call from [facility] in regards to patients disability. She is working on continuing the patient's disability and needed additional information in regards to patients medical status. Was able to review Dr. Hale Bogus office notes.   She'll call back with further questions.  Sylvanna Burggraf Lorita Officer, RN

## 2019-12-11 NOTE — Telephone Encounter (Signed)
Patient came to office requesting corrections on paper work done by office. Needs date on section for return to work without restrictions. Corrected form for patient. [place]

## 2020-01-13 ENCOUNTER — Other Ambulatory Visit: Payer: Self-pay | Admitting: Internal Medicine

## 2020-01-13 ENCOUNTER — Other Ambulatory Visit: Payer: Self-pay

## 2020-01-13 DIAGNOSIS — B20 Human immunodeficiency virus [HIV] disease: Secondary | ICD-10-CM

## 2020-01-13 MED ORDER — SULFAMETHOXAZOLE-TRIMETHOPRIM 800-160 MG PO TABS: 1.0000 | ORAL_TABLET | ORAL | 4 refills | Status: AC

## 2020-01-13 MED ORDER — BICTEGRAVIR-EMTRICITAB-TENOFOV 50-200-25 MG PO TABS: 1.0000 | ORAL_TABLET | Freq: Every day | ORAL | 4 refills | Status: AC

## 2020-10-16 IMAGING — DX DG CHEST 2V
2 series · 2 of 2 positions shown · non-contrast
Comparison: None.

CLINICAL DATA: Shortness of breath

EXAM:
CHEST - 2 VIEW

[chest pa]
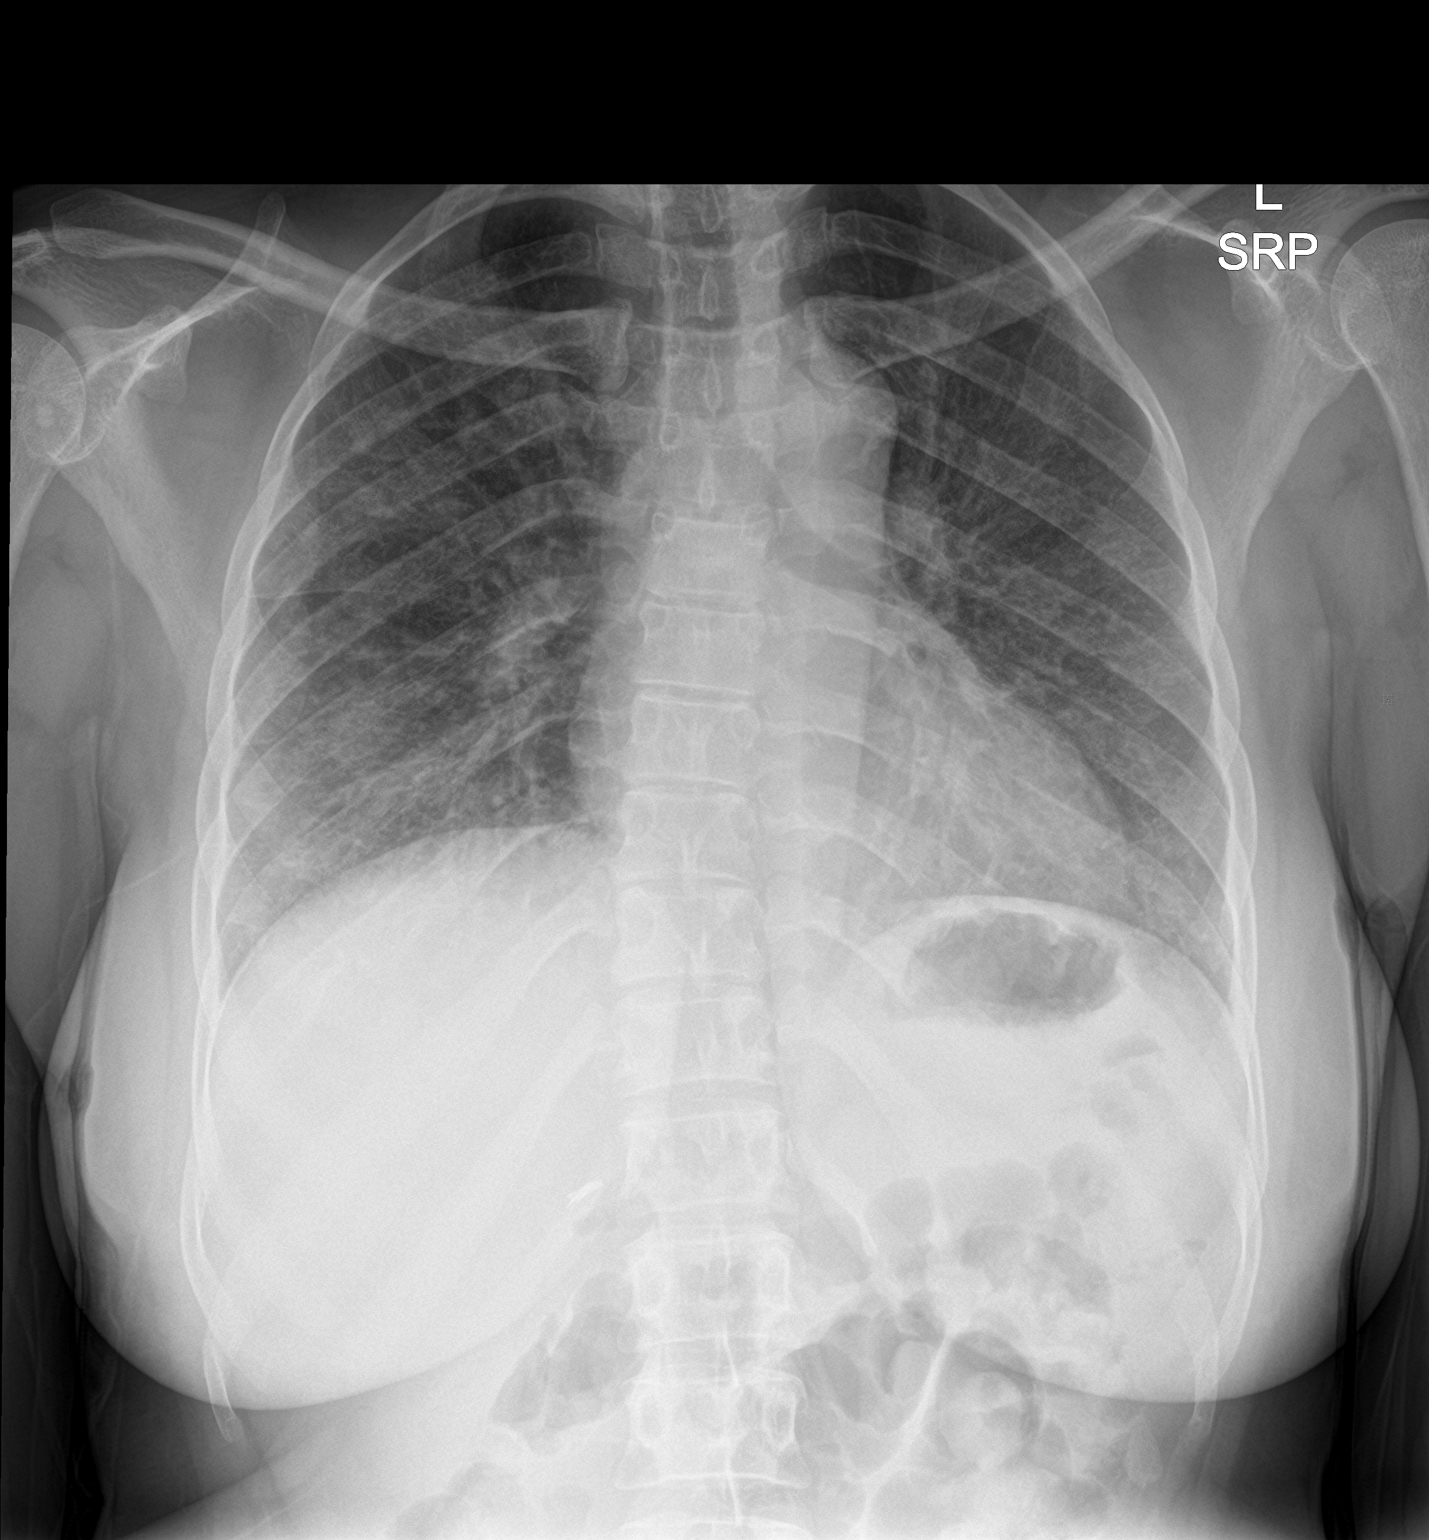

[chest lat]
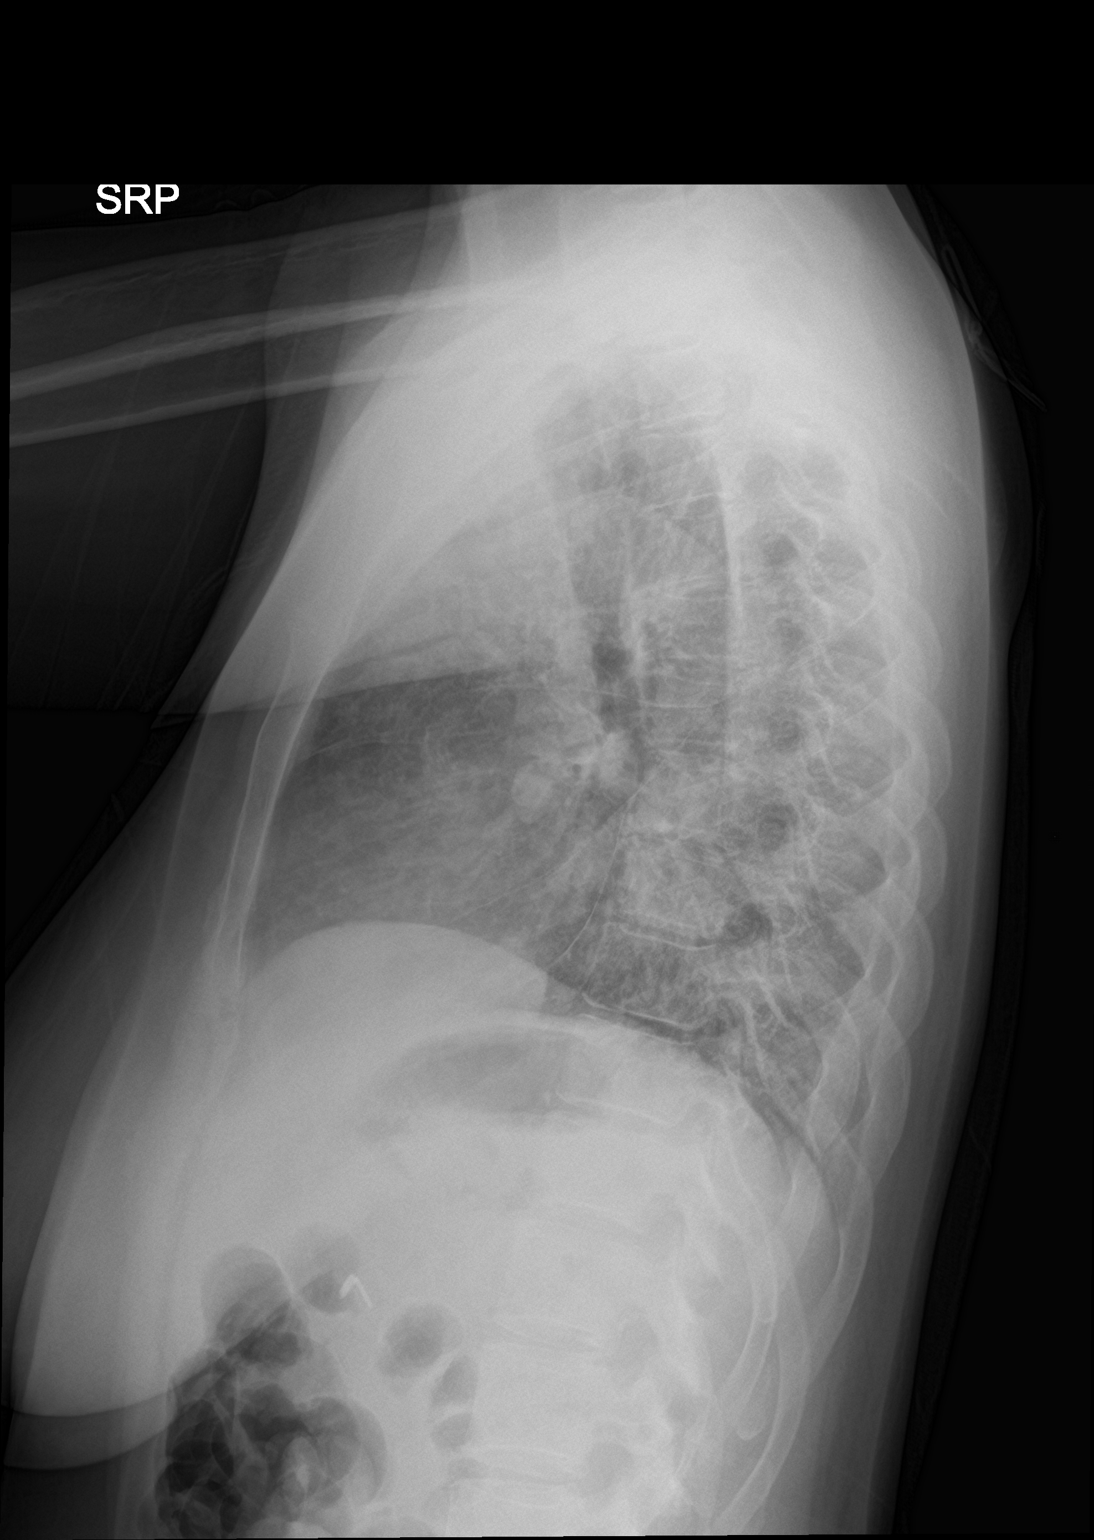

[2 of 2 positions shown; findings below may reference images not displayed]

FINDINGS: The heart size and mediastinal contours are within normal limits.
Bilateral heterogeneous airspace opacity, most conspicuous in the
bilateral lung bases. The visualized skeletal structures are
unremarkable.
IMPRESSION: Bilateral heterogeneous airspace opacity, most conspicuous in the
bilateral lung bases, consistent with multifocal infection and
[id number] infection, if suspected.

## 2020-10-18 IMAGING — DX DG CHEST 1V
1 series · 1 of 1 positions shown · non-contrast
Comparison: September 06, 2019.

CLINICAL DATA: Hypoxia

EXAM:
CHEST  1 VIEW

[chest ap]
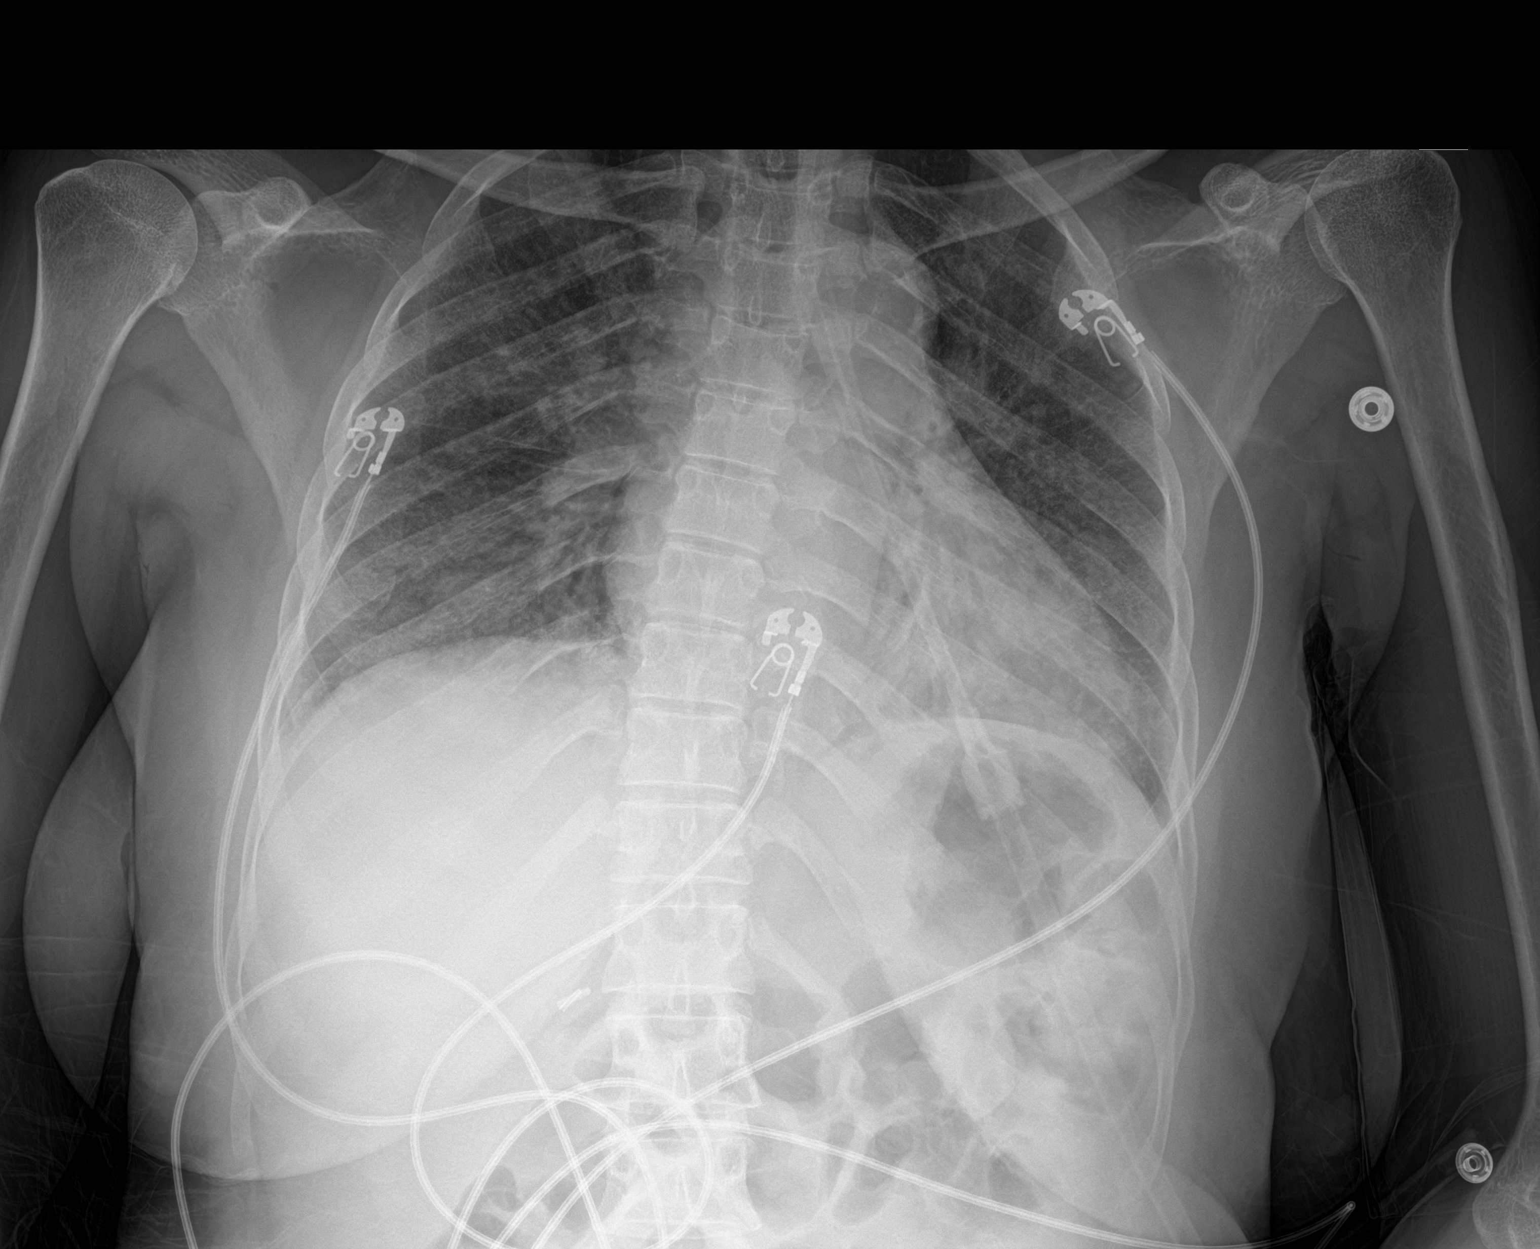

[1 of 1 positions shown; findings below may reference images not displayed]

FINDINGS: There is airspace opacity in the left lower lung region. The lungs
elsewhere are clear. Heart is upper normal in size with pulmonary
vascularity within normal limits. No adenopathy. No evident bone
lesions.
IMPRESSION: Left lower lobe infiltrate consistent with pneumonia. Cardiac
silhouette within normal limits. No adenopathy evident.

## 2021-07-15 ENCOUNTER — Telehealth: Payer: Self-pay

## 2021-07-15 NOTE — Telephone Encounter (Signed)
Patient last seen 10/2019, RN called to offer appointment and re-engage in care, no answer. Left HIPAA compliant voicemail requesting callback.   Beryle Flock, RN

## 2021-09-23 ENCOUNTER — Telehealth: Payer: Self-pay

## 2021-09-23 NOTE — Telephone Encounter (Signed)
Patient last seen 10/2019 - called to offer appointment, no answer. Left HIPAA compliant voicemail requesting callback.   Beryle Flock, RN

## 2021-11-11 ENCOUNTER — Ambulatory Visit: Payer: BC Managed Care – PPO | Admitting: Internal Medicine

## 2021-12-09 ENCOUNTER — Telehealth: Payer: Self-pay

## 2021-12-09 NOTE — Telephone Encounter (Signed)
Patient missed 10/2021 appointment, called to reschedule. No answer on either number. Left HIPAA compliant voicemails requesting callback.  ? ?Beryle Flock, RN ? ?

## 2024-08-29 DEATH — deceased
